# Patient Record
Sex: Male | Born: 1950
Health system: Southern US, Community
[De-identification: ages and names within clinical notes are randomized; demographics above are authoritative.]

## PROBLEM LIST (undated history)

## (undated) DIAGNOSIS — I350 Nonrheumatic aortic (valve) stenosis: Secondary | ICD-10-CM

## (undated) DIAGNOSIS — I2510 Atherosclerotic heart disease of native coronary artery without angina pectoris: Secondary | ICD-10-CM

## (undated) DIAGNOSIS — K852 Alcohol induced acute pancreatitis without necrosis or infection: Secondary | ICD-10-CM

## (undated) DIAGNOSIS — F1010 Alcohol abuse, uncomplicated: Secondary | ICD-10-CM

## (undated) DIAGNOSIS — E785 Hyperlipidemia, unspecified: Secondary | ICD-10-CM

## (undated) DIAGNOSIS — I5189 Other ill-defined heart diseases: Secondary | ICD-10-CM

## (undated) DIAGNOSIS — Z789 Other specified health status: Secondary | ICD-10-CM

## (undated) DIAGNOSIS — I358 Other nonrheumatic aortic valve disorders: Secondary | ICD-10-CM

## (undated) DIAGNOSIS — G709 Myoneural disorder, unspecified: Secondary | ICD-10-CM

## (undated) DIAGNOSIS — R011 Cardiac murmur, unspecified: Secondary | ICD-10-CM

## (undated) DIAGNOSIS — K859 Acute pancreatitis without necrosis or infection, unspecified: Secondary | ICD-10-CM

## (undated) DIAGNOSIS — Z8679 Personal history of other diseases of the circulatory system: Secondary | ICD-10-CM

## (undated) DIAGNOSIS — M1990 Unspecified osteoarthritis, unspecified site: Secondary | ICD-10-CM

## (undated) DIAGNOSIS — K219 Gastro-esophageal reflux disease without esophagitis: Secondary | ICD-10-CM

## (undated) DIAGNOSIS — Q2381 Bicuspid aortic valve: Secondary | ICD-10-CM

## (undated) DIAGNOSIS — Z981 Arthrodesis status: Secondary | ICD-10-CM

## (undated) HISTORY — DX: Other nonrheumatic aortic valve disorders: I35.8

## (undated) HISTORY — DX: Hyperlipidemia, unspecified: E78.5

## (undated) HISTORY — PX: LUMBAR FUSION: SHX111

## (undated) HISTORY — DX: Nonrheumatic aortic (valve) stenosis: I35.0

---

## 2000-05-16 ENCOUNTER — Ambulatory Visit (HOSPITAL_COMMUNITY): Admission: RE | Admit: 2000-05-16 | Discharge: 2000-05-16 | Payer: Self-pay | Admitting: Neurosurgery

## 2000-05-16 ENCOUNTER — Encounter: Payer: Self-pay | Admitting: Neurosurgery

## 2000-05-30 ENCOUNTER — Ambulatory Visit (HOSPITAL_COMMUNITY): Admission: RE | Admit: 2000-05-30 | Discharge: 2000-05-30 | Payer: Self-pay | Admitting: Neurosurgery

## 2000-05-30 ENCOUNTER — Encounter: Payer: Self-pay | Admitting: Neurosurgery

## 2000-06-13 ENCOUNTER — Encounter: Payer: Self-pay | Admitting: Neurosurgery

## 2000-06-13 ENCOUNTER — Ambulatory Visit (HOSPITAL_COMMUNITY): Admission: RE | Admit: 2000-06-13 | Discharge: 2000-06-13 | Payer: Self-pay | Admitting: Neurosurgery

## 2001-12-15 ENCOUNTER — Emergency Department (HOSPITAL_COMMUNITY): Admission: EM | Admit: 2001-12-15 | Discharge: 2001-12-16 | Payer: Self-pay | Admitting: Emergency Medicine

## 2001-12-15 ENCOUNTER — Encounter: Payer: Self-pay | Admitting: Emergency Medicine

## 2002-07-01 ENCOUNTER — Encounter: Payer: Self-pay | Admitting: Internal Medicine

## 2002-07-01 ENCOUNTER — Encounter: Admission: RE | Admit: 2002-07-01 | Discharge: 2002-07-01 | Payer: Self-pay | Admitting: Internal Medicine

## 2002-07-06 ENCOUNTER — Ambulatory Visit (HOSPITAL_COMMUNITY): Admission: RE | Admit: 2002-07-06 | Discharge: 2002-07-06 | Payer: Self-pay | Admitting: Internal Medicine

## 2004-06-08 ENCOUNTER — Ambulatory Visit (HOSPITAL_COMMUNITY): Admission: RE | Admit: 2004-06-08 | Discharge: 2004-06-08 | Payer: Self-pay | Admitting: Gastroenterology

## 2005-05-06 ENCOUNTER — Encounter: Admission: RE | Admit: 2005-05-06 | Discharge: 2005-05-06 | Payer: Self-pay | Admitting: Internal Medicine

## 2008-09-14 ENCOUNTER — Emergency Department: Payer: Self-pay | Admitting: Emergency Medicine

## 2009-02-11 ENCOUNTER — Inpatient Hospital Stay: Payer: Self-pay | Admitting: Internal Medicine

## 2010-02-06 ENCOUNTER — Inpatient Hospital Stay (HOSPITAL_COMMUNITY): Admission: RE | Admit: 2010-02-06 | Discharge: 2010-02-08 | Payer: Self-pay | Admitting: Neurosurgery

## 2010-07-12 LAB — COMPREHENSIVE METABOLIC PANEL
ALT: 16 U/L (ref 0–53)
AST: 22 U/L (ref 0–37)
Albumin: 4.3 g/dL (ref 3.5–5.2)
Alkaline Phosphatase: 45 U/L (ref 39–117)
BUN: 10 mg/dL (ref 6–23)
CO2: 31 mEq/L (ref 19–32)
Calcium: 9.8 mg/dL (ref 8.4–10.5)
Chloride: 103 mEq/L (ref 96–112)
Creatinine, Ser: 1.05 mg/dL (ref 0.4–1.5)
GFR calc Af Amer: >60 mL/min (ref >60)
GFR calc non Af Amer: >60 mL/min (ref >60)
Glucose, Bld: 99 mg/dL (ref 70–99)
Potassium: 4.4 mEq/L (ref 3.5–5.1)
Sodium: 139 mEq/L (ref 135–145)
Total Bilirubin: 0.5 mg/dL (ref 0.3–1.2)
Total Protein: 7.1 g/dL (ref 6.0–8.3)

## 2010-07-12 LAB — CBC
HCT: 42.4 % (ref 39.0–52.0)
Hemoglobin: 14.6 g/dL (ref 13.0–17.0)
MCH: 33.5 pg (ref 26.0–34.0)
MCHC: 34.4 g/dL (ref 30.0–36.0)
MCV: 97.2 fL (ref 78.0–100.0)
Platelets: 207 10*3/uL (ref 150–400)
RBC: 4.36 MIL/uL (ref 4.22–5.81)
RDW: 12.6 % (ref 11.5–15.5)
WBC: 5 10*3/uL (ref 4.0–10.5)

## 2010-07-12 LAB — TYPE AND SCREEN
ABO/RH(D): O POS
Antibody Screen: NEGATIVE

## 2010-07-12 LAB — SURGICAL PCR SCREEN
MRSA, PCR: NEGATIVE
Staphylococcus aureus: NEGATIVE

## 2010-07-12 LAB — ABO/RH: ABO/RH(D): O POS

## 2010-09-14 NOTE — Op Note (Signed)
NAME:  Dylan Long, Dylan Long                  ACCOUNT NO.:  [id number]   MEDICAL RECORD NO.:  [id number]          PATIENT TYPE:  AMB   LOCATION:  ENDO                         FACILITY:  [facility]   PHYSICIAN:  Matheo L. Malon Kindle., M.D.DATE OF BIRTH:  1950-08-19   DATE OF PROCEDURE:  06/08/2004  DATE OF DISCHARGE:                                 OPERATIVE REPORT   PROCEDURE:  Colonoscopy.   MEDICATIONS:  Fentanyl 100 mcg, Versed 10 mg IV.   SCOPE:  Olympus adjustable pediatric colonoscope.   INDICATIONS FOR PROCEDURE:  Colon cancer screening.   DESCRIPTION OF PROCEDURE:  The procedure had been explained to the patient  and consent obtained. With the patient in the left lateral decubitus  position, a digital exam was performed. The Olympus pediatric scope was  inserted and advanced. The prep was excellent. We were able to  reach the  cecum without difficulty. The ileocecal valve and appendiceal orifice seen.  The scope was withdrawn and the cecum, ascending colon, transverse colon,  splenic flexure, descending and sigmoid colon were seen well. There was no  diverticular disease seen. No polyps were seen throughout. The rectum was  free of polyps. The scope was withdrawn.  The patient tolerated the  procedure well.   ASSESSMENT:  Normal screening colonoscopy, V76.51.   PLAN:  Will recommend yearly Hemoccults and repeat colonoscopy in five  years.      JLE/MEDQ  D:  06/08/2004  T:  06/08/2004  Job:  [id number]   cc:   Loraine Leriche A. Waynard Edwards, M.D.  [address]  [place]  [place] [address]  Fax: [phone]

## 2010-09-14 NOTE — Op Note (Signed)
   NAME:  Dylan Long, Dylan Long NO.:  [id number]   MEDICAL RECORD NO.:  [id number]                   PATIENT TYPE:  EMS   LOCATION:  MAJO                                 FACILITY:  [facility]   PHYSICIAN:  Haralambos L. Malon Kindle., M.D.          DATE OF BIRTH:  [birth year]   DATE OF PROCEDURE:  12/16/2001  DATE OF DISCHARGE:                                 OPERATIVE REPORT   PROCEDURE:  Esophagogastroduodenoscopy and removal of food impaction.   MEDICATIONS:  Cetacaine spray, fentanyl 100 mcg, Versed 10 mg IV.   INDICATIONS:  A 60 year old gentleman with a long history of heartburn.  Was  eating steak tonight approximately four hours ago, got it stuck and has been  unable to swallow since.   DESCRIPTION OF PROCEDURE:  The procedure, including the potential risks and  complications, was explained to the patient and his wife and consent  obtained.  With the patient in the left lateral decubitus position, the  scope was inserted.  He continued to retch.  We passed down in the  esophagus.  There was a meat impaction admidst ulcerative esophagitis that  we passed the scope down on top of with a really very gentle pressure, and  it pushed on into the stomach easily.  The scope easily passed in the  stomach.  The pylorus was identified and passed.  The duodenum, including  the bulb and second portion, was grossly normal.  The pyloric channel was  normal, as was then antrum and body of the stomach, with no ulcerations or  masses.  The fundus and cardia were seen in the retroflex views and appeared  to be normal.  There was a fairly large hiatal hernia and in the distal  esophagus was marked ulcerative esophagitis.  The scope easily passed  through this.  There were no residual food particles.  The scope was  withdrawn.  The patient tolerated the procedure well reasonably well, was  awake and alert at the termination of the procedure.   ASSESSMENT:  1. Meat impaction,  relieved.  2. Fairly significant ulcerative esophagitis.   PLAN:  Will begin the patient on Aciphex and see back in the office in six  weeks.  He will take clear liquids tonight and call for any chest pain,  trouble breathing, etc.                                               Tyliek L. Malon Kindle., M.D.    Waldron Session  D:  12/16/2001  T:  12/17/2001  Job:  [id number]   cc:   Loraine Leriche A. Perini, M.D.

## 2011-01-23 ENCOUNTER — Other Ambulatory Visit: Payer: Self-pay | Admitting: Internal Medicine

## 2011-01-23 DIAGNOSIS — M5416 Radiculopathy, lumbar region: Secondary | ICD-10-CM

## 2011-01-23 DIAGNOSIS — M1990 Unspecified osteoarthritis, unspecified site: Secondary | ICD-10-CM

## 2011-01-25 ENCOUNTER — Other Ambulatory Visit: Payer: Self-pay | Admitting: Internal Medicine

## 2011-01-25 DIAGNOSIS — M1990 Unspecified osteoarthritis, unspecified site: Secondary | ICD-10-CM

## 2011-01-25 DIAGNOSIS — M5416 Radiculopathy, lumbar region: Secondary | ICD-10-CM

## 2011-01-29 ENCOUNTER — Ambulatory Visit
Admission: RE | Admit: 2011-01-29 | Discharge: 2011-01-29 | Disposition: A | Discharge status: Home / Self Care | Payer: Medicare HMO | Source: Ambulatory Visit | Attending: Internal Medicine | Admitting: Internal Medicine

## 2011-01-29 DIAGNOSIS — M1990 Unspecified osteoarthritis, unspecified site: Secondary | ICD-10-CM

## 2011-01-29 DIAGNOSIS — M5416 Radiculopathy, lumbar region: Secondary | ICD-10-CM

## 2011-01-29 MED ORDER — GADOBENATE DIMEGLUMINE 529 MG/ML IV SOLN
16.0000 mL | Freq: Once | INTRAVENOUS | Status: AC | PRN
  Administered 2011-01-29: 16 mL via INTRAVENOUS

## 2011-08-14 ENCOUNTER — Telehealth: Payer: Self-pay | Admitting: Cardiology

## 2011-08-14 NOTE — Telephone Encounter (Signed)
Patient called wanting to know his diagnosis.States his daughter just had a baby that has a heart murmur.Patient was told diagnosis and advised needs to schedule appointment.

## 2011-08-14 NOTE — Telephone Encounter (Signed)
Patient was called back appointment scheduled with Dr.Jordan 09/26/11.

## 2011-08-14 NOTE — Telephone Encounter (Signed)
New msg Pt has seen Dr Reyes Ivan in the past and wanted to talk to you about his diagnosis. Please call him back

## 2011-08-14 NOTE — Telephone Encounter (Signed)
Patient called no answer,LMTC 

## 2011-08-24 IMAGING — CR DG LUMBAR SPINE 1V
1 series · 1 of 1 positions shown · non-contrast
Comparison: None

CLINICAL DATA: Lumbar decompression surgery.  Intraoperative
localization.

LUMBAR SPINE - 1 VIEW

[view not recorded]
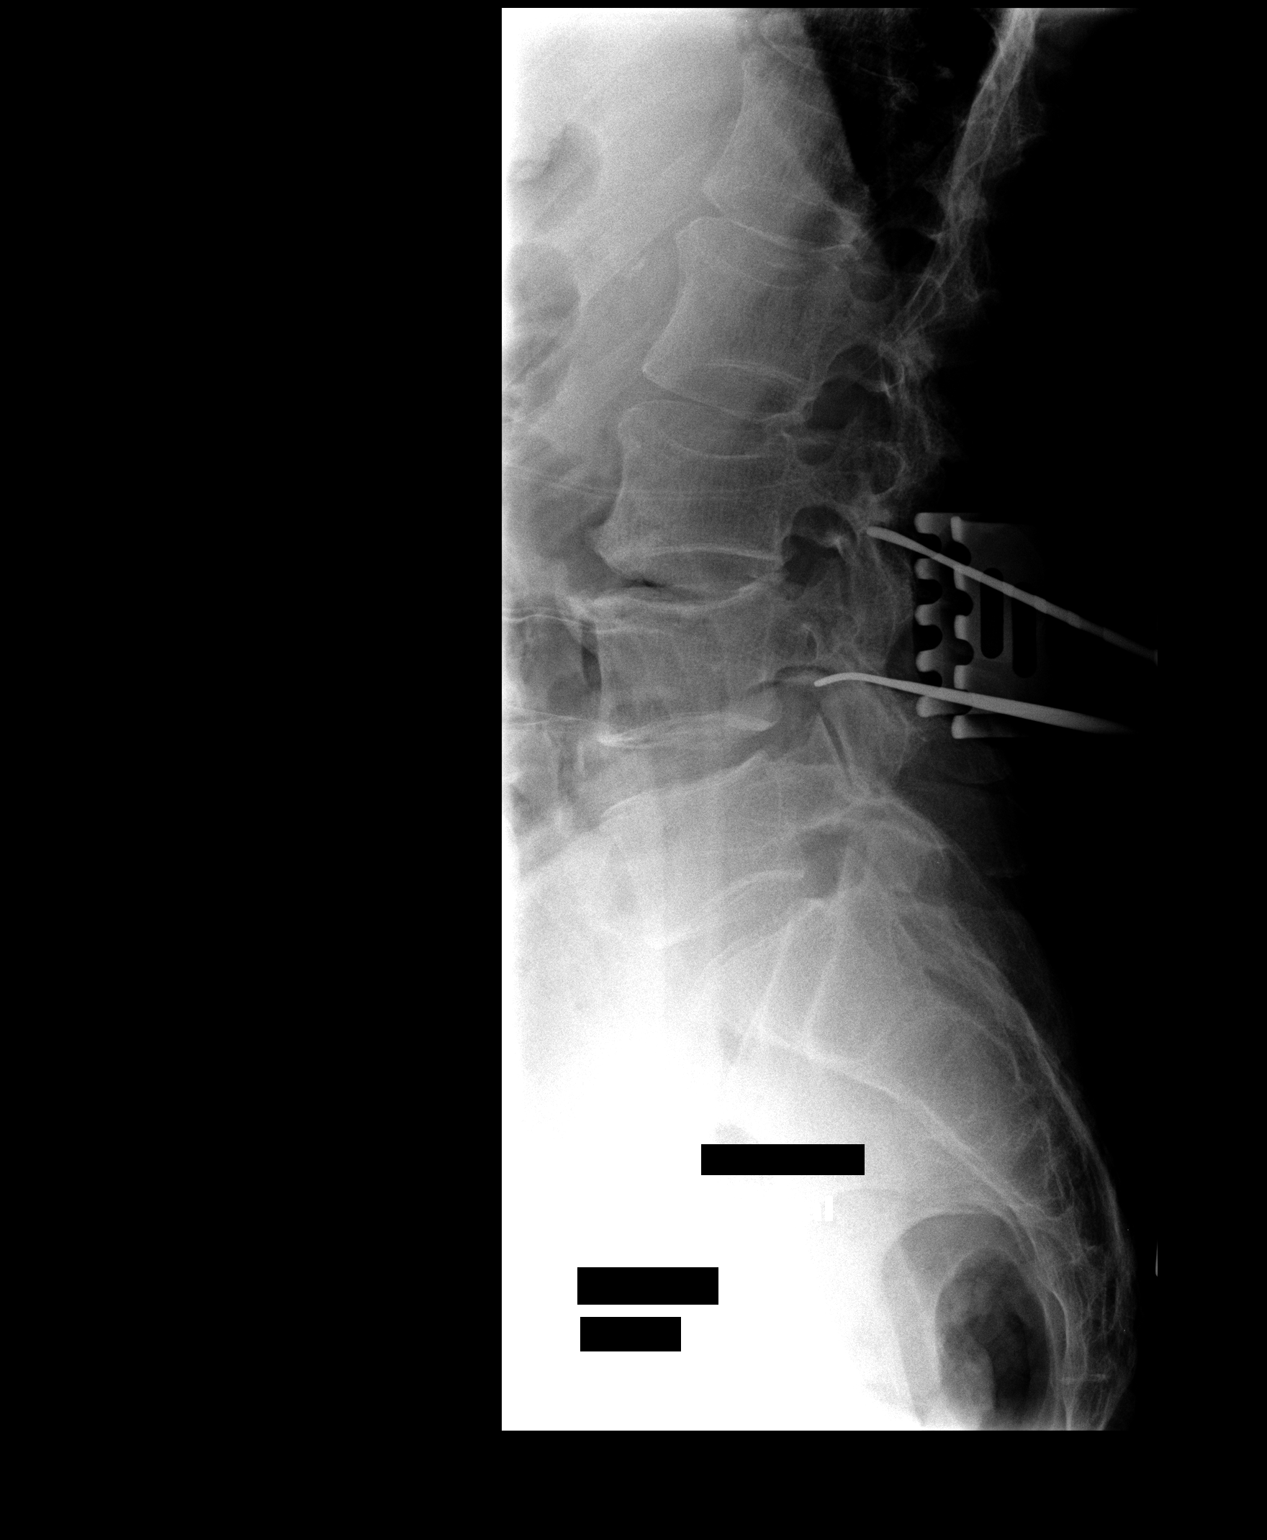

[1 of 1 positions shown; findings below may reference images not displayed]

FINDINGS: There are surgical instruments marking the L3 and L4
vertebral body levels.
IMPRESSION: L3 and L4 marked intraoperatively.

## 2011-09-26 ENCOUNTER — Encounter: Payer: Self-pay | Admitting: Cardiology

## 2011-09-26 ENCOUNTER — Ambulatory Visit: Payer: Medicare HMO | Admitting: Cardiology

## 2011-09-26 ENCOUNTER — Ambulatory Visit (INDEPENDENT_AMBULATORY_CARE_PROVIDER_SITE_OTHER): Payer: Medicare HMO | Admitting: Cardiology

## 2011-09-26 VITALS — BP 160/86 | HR 58 | Ht 75.0 in | Wt 178.4 lb

## 2011-09-26 DIAGNOSIS — E785 Hyperlipidemia, unspecified: Secondary | ICD-10-CM

## 2011-09-26 DIAGNOSIS — Q2381 Bicuspid aortic valve: Secondary | ICD-10-CM

## 2011-09-26 DIAGNOSIS — Q231 Congenital insufficiency of aortic valve: Secondary | ICD-10-CM

## 2011-09-26 NOTE — Patient Instructions (Signed)
We will schedule you for an echocardiogram  If stable we'll just check you again in 3 years.

## 2011-09-26 NOTE — Assessment & Plan Note (Signed)
He has a history of very mild aortic stenosis. We will update his echocardiogram. If stable I would recommend repeat in 3 years.

## 2011-09-26 NOTE — Progress Notes (Signed)
   Osborne Oman Date of Birth: 1951-01-16 Medical Record #[id number]  History of Present Illness: Mr. Cleckler is seen today for followup of a bicuspid aortic valve. He is a pleasant 61 year old male previously followed by Dr. Reyes Ivan who has a history of bicuspid aortic valve. His last echocardiogram in 2010 showed a mean gradient of 9 mmHg, peak gradient of 17 mm mercury, and a valve area of 2.2 cm square. He remains asymptomatic. He has no chest pain, dizziness, or shortness of breath. He remains very active. 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  Current Outpatient Prescriptions on File Prior to Visit  Medication Sig Dispense Refill  . HYDROcodone-acetaminophen (VICODIN) 5-500 MG per tablet Take 1 tablet by mouth every 6 (six) hours as needed.      . RABEprazole (ACIPHEX) 20 MG tablet Take 20 mg by mouth daily.      . rosuvastatin (CRESTOR) 5 MG tablet Take 5 mg by mouth daily. 5 mg once a week      . temazepam (RESTORIL) 15 MG capsule Take 30 mg by mouth at bedtime as needed.         No Known Allergies  Past Medical History  Diagnosis Date  . Aortic stenosis   . Dyslipidemia   . Aortic valve sclerosis     with functionally bicuspid aortic valve    Past Surgical History  Procedure Date  . Lumbar fusion     History  Smoking status  . Former Smoker  . Quit date: 09/26/1999  Smokeless tobacco  . Not on file    History  Alcohol Use  . Yes    Family History  Problem Relation Age of Onset  . Stroke Mother   . Heart disease Father   . Heart attack Father   . Hyperlipidemia Brother     Dyslipidemia    Review of Systems: The review of systems is positive for chronic back pain.  All other systems were reviewed and are negative.  Physical Exam: BP 160/86  Pulse 58  Ht 6\' 3"  (1.905 m)  Wt 178 lb 6.4 oz (80.922 kg)  BMI 22.30 kg/m2 He is a tall, slender white male in no acute distress. He is normocephalic, atraumatic. Pupils are equal round  reactive. Sclera clear. He wears glasses. Oropharynx is clear. Neck is supple without JVD, adenopathy, thyromegaly, or bruits. Chronic obstruction brisk. Lungs are clear. Cardiac exam reveals a regular rate and rhythm with a grade 2/6 systolic ejection murmur loudest at the left lower sternal border radiating to the right upper sternal border. Abdomen is thin, soft, nontender. No masses or bruits. Extremities are without edema. Femoral and pedal pulses are 2+. He is alert and oriented x3. He is hard of hearing. Cranial nerves II through XII are otherwise intact. LABORATORY DATA: ECG today shows normal sinus rhythm with a rate of 58 beats per minute. It is normal.  Assessment / Plan:

## 2011-10-08 ENCOUNTER — Ambulatory Visit (HOSPITAL_COMMUNITY): Payer: Private Health Insurance - Indemnity | Attending: Internal Medicine

## 2011-10-08 DIAGNOSIS — I359 Nonrheumatic aortic valve disorder, unspecified: Secondary | ICD-10-CM | POA: Insufficient documentation

## 2011-10-08 DIAGNOSIS — Q231 Congenital insufficiency of aortic valve: Secondary | ICD-10-CM | POA: Insufficient documentation

## 2011-10-08 DIAGNOSIS — I517 Cardiomegaly: Secondary | ICD-10-CM | POA: Insufficient documentation

## 2011-10-08 DIAGNOSIS — E785 Hyperlipidemia, unspecified: Secondary | ICD-10-CM | POA: Insufficient documentation

## 2011-10-08 NOTE — Progress Notes (Signed)
Echocardiogram performed.  

## 2011-10-11 ENCOUNTER — Telehealth: Payer: Self-pay | Admitting: Emergency Medicine

## 2011-10-11 ENCOUNTER — Telehealth: Payer: Self-pay | Admitting: Cardiology

## 2011-10-11 NOTE — Telephone Encounter (Signed)
Pt notified of echo results

## 2011-10-11 NOTE — Telephone Encounter (Signed)
Patient returning nurse call, he can be reached at [phone]

## 2011-10-11 NOTE — Telephone Encounter (Signed)
error 

## 2011-10-16 ENCOUNTER — Other Ambulatory Visit: Payer: Self-pay

## 2011-10-28 ENCOUNTER — Telehealth: Payer: Self-pay | Admitting: Cardiology

## 2011-10-28 NOTE — Telephone Encounter (Signed)
Amanda/Dr.Perini Office Calling asking for Referral Form faxed over last Week, she Needs Back Call her @  [phone]

## 2011-10-28 NOTE — Telephone Encounter (Signed)
Dr.Perini's office called already closed.Will call tomorrow 10/29/11.

## 2011-10-29 NOTE — Telephone Encounter (Signed)
Spoke to Margaret at Dr.Perini's office she stated she needed proof that patient was referred to our practice.States patient's insurance has denied payment and she was trying to obtain proof that patient was referred here.Marchelle Folks was told patient is a old patient of Dr.Kersey unable to find anything.Marchelle Folks stated she will continue to look and she will call back if needed.

## 2012-05-06 ENCOUNTER — Encounter: Payer: Self-pay | Admitting: Cardiovascular Disease

## 2013-01-27 ENCOUNTER — Ambulatory Visit (INDEPENDENT_AMBULATORY_CARE_PROVIDER_SITE_OTHER): Payer: Managed Care, Other (non HMO)

## 2013-01-27 VITALS — BP 168/92 | HR 59 | Temp 97.0°F | Resp 12 | Ht 76.0 in | Wt 180.0 lb

## 2013-01-27 DIAGNOSIS — M722 Plantar fascial fibromatosis: Secondary | ICD-10-CM

## 2013-01-27 NOTE — Patient Instructions (Signed)
Plantar Fasciitis Plantar fasciitis is a common condition that causes foot pain. It is soreness (inflammation) of the band of tough fibrous tissue on the bottom of the foot that runs from the heel bone (calcaneus) to the ball of the foot. The cause of this soreness may be from excessive standing, poor fitting shoes, running on hard surfaces, being overweight, having an abnormal walk, or overuse (this is common in runners) of the painful foot or feet. It is also common in aerobic exercise dancers and ballet dancers. SYMPTOMS  Most people with plantar fasciitis complain of:  Severe pain in the morning on the bottom of their foot especially when taking the first steps out of bed. This pain recedes after a few minutes of walking.  Severe pain is experienced also during walking following a long period of inactivity.  Pain is worse when walking barefoot or up stairs DIAGNOSIS   Your caregiver will diagnose this condition by examining and feeling your foot.  Special tests such as X-rays of your foot, are usually not needed. PREVENTION   Consult a sports medicine professional before beginning a new exercise program.  Walking programs offer a good workout. With walking there is a lower chance of overuse injuries common to runners. There is less impact and less jarring of the joints.  Begin all new exercise programs slowly. If problems or pain develop, decrease the amount of time or distance until you are at a comfortable level.  Wear good shoes and replace them regularly.  Stretch your foot and the heel cords at the back of the ankle (Achilles tendon) both before and after exercise.  Run or exercise on even surfaces that are not hard. For example, asphalt is better than pavement.  Do not run barefoot on hard surfaces.  If using a treadmill, vary the incline.  Do not continue to workout if you have foot or joint problems. Seek professional help if they do not improve. HOME CARE INSTRUCTIONS     Avoid activities that cause you pain until you recover.  Use ice or cold packs on the problem or painful areas after working out.  Only take over-the-counter or prescription medicines for pain, discomfort, or fever as directed by your caregiver.  Soft shoe inserts or athletic shoes with air or gel sole cushions may be helpful.  If problems continue or become more severe, consult a sports medicine caregiver or your own health care provider. Cortisone is a potent anti-inflammatory medication that may be injected into the painful area. You can discuss this treatment with your caregiver. MAKE SURE YOU:   Understand these instructions.  Will watch your condition.  Will get help right away if you are not doing well or get worse. Document Released: 01/08/2001 Document Revised: 07/08/2011 Document Reviewed: 03/09/2008 [facility] Patient Information 2014 [place], [place].   Continue Mobic 15 mg once daily, maintain fascial strapping for 5 more days. Apply ice to inferior heel as needed.

## 2013-01-27 NOTE — Progress Notes (Signed)
Mr. Dylan Long was seen today for followup of plantar fasciitis right foot, medications and history were reviewed. Patient indicated improvement with fascial strapping and steroid injection. Based on improvement patient is a candidate for functional orthoses. Neurovascular status intact and unchanged. Orthotic skin is carried out at today's visit and fascial strapping reapplied for 5 days. Patient will maintain Mobic 15 mg once daily as needed for pain. Patiently contacted when orthotics are ready with the next 3-4 weeks orthotics which dispensed and fitted at that time.  Alvan Dame DPM

## 2013-03-12 ENCOUNTER — Ambulatory Visit (INDEPENDENT_AMBULATORY_CARE_PROVIDER_SITE_OTHER): Payer: Managed Care, Other (non HMO)

## 2013-03-12 VITALS — BP 127/74 | HR 62 | Resp 16

## 2013-03-12 DIAGNOSIS — M722 Plantar fascial fibromatosis: Secondary | ICD-10-CM

## 2013-03-12 NOTE — Patient Instructions (Signed)

## 2013-03-12 NOTE — Progress Notes (Signed)
  Subjective:    Patient ID: Dylan Long, male    DOB: 1950-12-23, 62 y.o.   MRN: [id number] "They're not too bad."  Patient presents to pick up orthotics, instructions were given.  HPI no changes in history    Review of Systems     Objective:   Physical Exam No changes neurovascular status intact oral and written instructions given and orthotic use and break in period patient is persistent plantar fasciitis/heel spur syndrome followup having had improvement with injections and strappings. Orthotics are dispensed at this time      Assessment & Plan:  Dispensed orthotics with wrist reconstruction followup in one to 2 months for adjustments if needed maintain orthotics and shoes with a good lace up orthotics shoe at all times. Recheck within a month or 2 if needed otherwise should last 5-10 t. Assessment plantar fasciitis/heel spurs syndrome  Alvan Dame DPM

## 2015-09-14 ENCOUNTER — Other Ambulatory Visit (HOSPITAL_COMMUNITY): Payer: Self-pay | Admitting: Internal Medicine

## 2015-09-14 DIAGNOSIS — I350 Nonrheumatic aortic (valve) stenosis: Secondary | ICD-10-CM

## 2015-09-20 ENCOUNTER — Ambulatory Visit (HOSPITAL_COMMUNITY): Payer: Managed Care, Other (non HMO) | Attending: Cardiovascular Disease

## 2015-09-20 ENCOUNTER — Other Ambulatory Visit: Payer: Self-pay

## 2015-09-20 DIAGNOSIS — Z87891 Personal history of nicotine dependence: Secondary | ICD-10-CM | POA: Insufficient documentation

## 2015-09-20 DIAGNOSIS — I071 Rheumatic tricuspid insufficiency: Secondary | ICD-10-CM | POA: Insufficient documentation

## 2015-09-20 DIAGNOSIS — I350 Nonrheumatic aortic (valve) stenosis: Secondary | ICD-10-CM

## 2015-09-20 DIAGNOSIS — E785 Hyperlipidemia, unspecified: Secondary | ICD-10-CM | POA: Insufficient documentation

## 2015-09-20 DIAGNOSIS — I352 Nonrheumatic aortic (valve) stenosis with insufficiency: Secondary | ICD-10-CM | POA: Insufficient documentation

## 2015-09-20 DIAGNOSIS — I340 Nonrheumatic mitral (valve) insufficiency: Secondary | ICD-10-CM | POA: Insufficient documentation

## 2015-11-13 DIAGNOSIS — M5417 Radiculopathy, lumbosacral region: Secondary | ICD-10-CM | POA: Diagnosis not present

## 2015-11-13 DIAGNOSIS — M9905 Segmental and somatic dysfunction of pelvic region: Secondary | ICD-10-CM | POA: Diagnosis not present

## 2015-11-13 DIAGNOSIS — M5136 Other intervertebral disc degeneration, lumbar region: Secondary | ICD-10-CM | POA: Diagnosis not present

## 2015-11-13 DIAGNOSIS — M9903 Segmental and somatic dysfunction of lumbar region: Secondary | ICD-10-CM | POA: Diagnosis not present

## 2015-11-14 DIAGNOSIS — M9903 Segmental and somatic dysfunction of lumbar region: Secondary | ICD-10-CM | POA: Diagnosis not present

## 2015-11-14 DIAGNOSIS — M5417 Radiculopathy, lumbosacral region: Secondary | ICD-10-CM | POA: Diagnosis not present

## 2015-11-14 DIAGNOSIS — M9905 Segmental and somatic dysfunction of pelvic region: Secondary | ICD-10-CM | POA: Diagnosis not present

## 2015-11-14 DIAGNOSIS — M5136 Other intervertebral disc degeneration, lumbar region: Secondary | ICD-10-CM | POA: Diagnosis not present

## 2015-12-18 DIAGNOSIS — B078 Other viral warts: Secondary | ICD-10-CM | POA: Diagnosis not present

## 2015-12-26 DIAGNOSIS — M5136 Other intervertebral disc degeneration, lumbar region: Secondary | ICD-10-CM | POA: Diagnosis not present

## 2015-12-26 DIAGNOSIS — M9903 Segmental and somatic dysfunction of lumbar region: Secondary | ICD-10-CM | POA: Diagnosis not present

## 2015-12-26 DIAGNOSIS — M5417 Radiculopathy, lumbosacral region: Secondary | ICD-10-CM | POA: Diagnosis not present

## 2015-12-26 DIAGNOSIS — M9905 Segmental and somatic dysfunction of pelvic region: Secondary | ICD-10-CM | POA: Diagnosis not present

## 2015-12-27 DIAGNOSIS — M9903 Segmental and somatic dysfunction of lumbar region: Secondary | ICD-10-CM | POA: Diagnosis not present

## 2015-12-27 DIAGNOSIS — M5136 Other intervertebral disc degeneration, lumbar region: Secondary | ICD-10-CM | POA: Diagnosis not present

## 2015-12-27 DIAGNOSIS — M5417 Radiculopathy, lumbosacral region: Secondary | ICD-10-CM | POA: Diagnosis not present

## 2015-12-27 DIAGNOSIS — M9905 Segmental and somatic dysfunction of pelvic region: Secondary | ICD-10-CM | POA: Diagnosis not present

## 2015-12-28 DIAGNOSIS — M9905 Segmental and somatic dysfunction of pelvic region: Secondary | ICD-10-CM | POA: Diagnosis not present

## 2015-12-28 DIAGNOSIS — M5136 Other intervertebral disc degeneration, lumbar region: Secondary | ICD-10-CM | POA: Diagnosis not present

## 2015-12-28 DIAGNOSIS — M9903 Segmental and somatic dysfunction of lumbar region: Secondary | ICD-10-CM | POA: Diagnosis not present

## 2015-12-28 DIAGNOSIS — M5417 Radiculopathy, lumbosacral region: Secondary | ICD-10-CM | POA: Diagnosis not present

## 2016-04-19 DIAGNOSIS — B078 Other viral warts: Secondary | ICD-10-CM | POA: Diagnosis not present

## 2016-06-06 DIAGNOSIS — B078 Other viral warts: Secondary | ICD-10-CM | POA: Diagnosis not present

## 2016-07-22 DIAGNOSIS — B078 Other viral warts: Secondary | ICD-10-CM | POA: Diagnosis not present

## 2016-07-22 DIAGNOSIS — L821 Other seborrheic keratosis: Secondary | ICD-10-CM | POA: Diagnosis not present

## 2016-08-23 ENCOUNTER — Encounter: Payer: Self-pay | Admitting: Emergency Medicine

## 2016-08-23 ENCOUNTER — Emergency Department: Payer: PPO

## 2016-08-23 ENCOUNTER — Inpatient Hospital Stay
Admission: EM | Admit: 2016-08-23 | Discharge: 2016-08-24 | DRG: 440 | Disposition: A | Discharge status: Home / Self Care | Payer: PPO | Attending: Internal Medicine | Admitting: Internal Medicine

## 2016-08-23 DIAGNOSIS — Z8249 Family history of ischemic heart disease and other diseases of the circulatory system: Secondary | ICD-10-CM | POA: Diagnosis not present

## 2016-08-23 DIAGNOSIS — R1011 Right upper quadrant pain: Secondary | ICD-10-CM | POA: Diagnosis not present

## 2016-08-23 DIAGNOSIS — K859 Acute pancreatitis without necrosis or infection, unspecified: Secondary | ICD-10-CM | POA: Diagnosis not present

## 2016-08-23 DIAGNOSIS — I350 Nonrheumatic aortic (valve) stenosis: Secondary | ICD-10-CM | POA: Diagnosis not present

## 2016-08-23 DIAGNOSIS — Z87891 Personal history of nicotine dependence: Secondary | ICD-10-CM

## 2016-08-23 DIAGNOSIS — K219 Gastro-esophageal reflux disease without esophagitis: Secondary | ICD-10-CM | POA: Diagnosis present

## 2016-08-23 DIAGNOSIS — F1010 Alcohol abuse, uncomplicated: Secondary | ICD-10-CM | POA: Diagnosis present

## 2016-08-23 DIAGNOSIS — D72829 Elevated white blood cell count, unspecified: Secondary | ICD-10-CM | POA: Diagnosis not present

## 2016-08-23 DIAGNOSIS — K852 Alcohol induced acute pancreatitis without necrosis or infection: Principal | ICD-10-CM | POA: Diagnosis present

## 2016-08-23 DIAGNOSIS — E785 Hyperlipidemia, unspecified: Secondary | ICD-10-CM | POA: Diagnosis not present

## 2016-08-23 DIAGNOSIS — R079 Chest pain, unspecified: Secondary | ICD-10-CM | POA: Diagnosis not present

## 2016-08-23 LAB — CBC
HCT: 49.6 % (ref 40.0–52.0)
Hemoglobin: 16.8 g/dL (ref 13.0–18.0)
MCH: 32.4 pg (ref 26.0–34.0)
MCHC: 33.8 g/dL (ref 32.0–36.0)
MCV: 95.9 fL (ref 80.0–100.0)
Platelets: 240 10*3/uL (ref 150–440)
RBC: 5.17 MIL/uL (ref 4.40–5.90)
RDW: 13.2 % (ref 11.5–14.5)
WBC: 16.8 10*3/uL — ABNORMAL HIGH (ref 3.8–10.6)

## 2016-08-23 LAB — BASIC METABOLIC PANEL
Anion gap: 7 (ref 5–15)
BUN: 15 mg/dL (ref 6–20)
CO2: 29 mmol/L (ref 22–32)
Calcium: 9.8 mg/dL (ref 8.9–10.3)
Chloride: 99 mmol/L — ABNORMAL LOW (ref 101–111)
Creatinine, Ser: 1.15 mg/dL (ref 0.61–1.24)
GFR calc Af Amer: >60 mL/min (ref >60)
GFR calc non Af Amer: >60 mL/min (ref >60)
Glucose, Bld: 145 mg/dL — ABNORMAL HIGH (ref 65–99)
Potassium: 4.2 mmol/L (ref 3.5–5.1)
Sodium: 135 mmol/L (ref 135–145)

## 2016-08-23 LAB — HEPATIC FUNCTION PANEL
ALT: 61 U/L (ref 17–63)
AST: 118 U/L — ABNORMAL HIGH (ref 15–41)
Albumin: 4.9 g/dL (ref 3.5–5.0)
Alkaline Phosphatase: 53 U/L (ref 38–126)
Bilirubin, Direct: 0.6 mg/dL — ABNORMAL HIGH (ref 0.1–0.5)
Indirect Bilirubin: 0.9 mg/dL (ref 0.3–0.9)
Total Bilirubin: 1.5 mg/dL — ABNORMAL HIGH (ref 0.3–1.2)
Total Protein: 8.2 g/dL — ABNORMAL HIGH (ref 6.5–8.1)

## 2016-08-23 LAB — TROPONIN I: Troponin I: <0.03 ng/mL (ref <0.03)

## 2016-08-23 LAB — LIPASE, BLOOD: Lipase: 1232 U/L — ABNORMAL HIGH (ref 11–51)

## 2016-08-23 MED ORDER — ACETAMINOPHEN 650 MG RE SUPP: 650.0000 mg | Freq: Four times a day (QID) | RECTAL | Status: DC | PRN

## 2016-08-23 MED ORDER — SENNOSIDES-DOCUSATE SODIUM 8.6-50 MG PO TABS: 1.0000 | ORAL_TABLET | Freq: Every evening | ORAL | Status: DC | PRN

## 2016-08-23 MED ORDER — LORAZEPAM 2 MG PO TABS: 0.0000 mg | ORAL_TABLET | Freq: Four times a day (QID) | ORAL | Status: DC

## 2016-08-23 MED ORDER — LORAZEPAM 1 MG PO TABS
1.0000 mg | ORAL_TABLET | Freq: Four times a day (QID) | ORAL | Status: DC | PRN
  Administered 2016-08-23: 1 mg via ORAL
  Filled 2016-08-23: qty 1

## 2016-08-23 MED ORDER — ADULT MULTIVITAMIN W/MINERALS CH: 1.0000 | ORAL_TABLET | Freq: Every day | ORAL | Status: DC

## 2016-08-23 MED ORDER — OXYCODONE-ACETAMINOPHEN 5-325 MG PO TABS
1.0000 | ORAL_TABLET | Freq: Once | ORAL | Status: AC
  Administered 2016-08-23: 1 via ORAL
  Filled 2016-08-23: qty 1

## 2016-08-23 MED ORDER — HYDROMORPHONE HCL 1 MG/ML IJ SOLN: 1.0000 mg | INTRAMUSCULAR | Status: DC | PRN

## 2016-08-23 MED ORDER — ONDANSETRON HCL 4 MG PO TABS: 4.0000 mg | ORAL_TABLET | Freq: Three times a day (TID) | ORAL | 0 refills | Status: DC | PRN

## 2016-08-23 MED ORDER — SODIUM CHLORIDE 0.9% FLUSH: 3.0000 mL | Freq: Two times a day (BID) | INTRAVENOUS | Status: DC

## 2016-08-23 MED ORDER — ONDANSETRON HCL 4 MG/2ML IJ SOLN
4.0000 mg | Freq: Once | INTRAMUSCULAR | Status: AC
  Administered 2016-08-23: 4 mg via INTRAVENOUS
  Filled 2016-08-23: qty 2

## 2016-08-23 MED ORDER — ENOXAPARIN SODIUM 40 MG/0.4ML ~~LOC~~ SOLN
40.0000 mg | SUBCUTANEOUS | Status: DC
  Administered 2016-08-23: 40 mg via SUBCUTANEOUS
  Filled 2016-08-23: qty 0.4

## 2016-08-23 MED ORDER — PANTOPRAZOLE SODIUM 40 MG IV SOLR
40.0000 mg | Freq: Two times a day (BID) | INTRAVENOUS | Status: DC
  Administered 2016-08-23: 40 mg via INTRAVENOUS
  Filled 2016-08-23 (×2): qty 40

## 2016-08-23 MED ORDER — ONDANSETRON HCL 4 MG/2ML IJ SOLN: 4.0000 mg | Freq: Four times a day (QID) | INTRAMUSCULAR | Status: DC | PRN

## 2016-08-23 MED ORDER — LORAZEPAM 2 MG PO TABS: 0.0000 mg | ORAL_TABLET | Freq: Two times a day (BID) | ORAL | Status: DC

## 2016-08-23 MED ORDER — MORPHINE SULFATE (PF) 4 MG/ML IV SOLN
4.0000 mg | Freq: Once | INTRAVENOUS | Status: AC
  Administered 2016-08-23: 4 mg via INTRAVENOUS
  Filled 2016-08-23: qty 1

## 2016-08-23 MED ORDER — ACETAMINOPHEN 325 MG PO TABS: 650.0000 mg | ORAL_TABLET | Freq: Four times a day (QID) | ORAL | Status: DC | PRN

## 2016-08-23 MED ORDER — SODIUM CHLORIDE 0.9 % IV SOLN
INTRAVENOUS | Status: DC
  Administered 2016-08-23 – 2016-08-24 (×3): via INTRAVENOUS

## 2016-08-23 MED ORDER — BISACODYL 5 MG PO TBEC: 5.0000 mg | DELAYED_RELEASE_TABLET | Freq: Every day | ORAL | Status: DC | PRN

## 2016-08-23 MED ORDER — VITAMIN B-1 100 MG PO TABS
100.0000 mg | ORAL_TABLET | Freq: Every day | ORAL | Status: DC
  Administered 2016-08-24: 100 mg via ORAL
  Filled 2016-08-23: qty 1

## 2016-08-23 MED ORDER — KETOROLAC TROMETHAMINE 15 MG/ML IJ SOLN: 15.0000 mg | Freq: Four times a day (QID) | INTRAMUSCULAR | Status: DC | PRN

## 2016-08-23 MED ORDER — THIAMINE HCL 100 MG/ML IJ SOLN
100.0000 mg | Freq: Every day | INTRAMUSCULAR | Status: DC
  Administered 2016-08-23: 100 mg via INTRAVENOUS
  Filled 2016-08-23: qty 2

## 2016-08-23 MED ORDER — ONDANSETRON HCL 4 MG PO TABS: 4.0000 mg | ORAL_TABLET | Freq: Four times a day (QID) | ORAL | Status: DC | PRN

## 2016-08-23 MED ORDER — HYDROCODONE-ACETAMINOPHEN 5-325 MG PO TABS
1.0000 | ORAL_TABLET | ORAL | Status: DC | PRN
  Administered 2016-08-23: 2 via ORAL
  Filled 2016-08-23: qty 2

## 2016-08-23 MED ORDER — FOLIC ACID 1 MG PO TABS
1.0000 mg | ORAL_TABLET | Freq: Every day | ORAL | Status: DC
  Administered 2016-08-24: 1 mg via ORAL
  Filled 2016-08-23: qty 1

## 2016-08-23 MED ORDER — LORAZEPAM 2 MG/ML IJ SOLN: 1.0000 mg | Freq: Four times a day (QID) | INTRAMUSCULAR | Status: DC | PRN

## 2016-08-23 MED ORDER — OXYCODONE-ACETAMINOPHEN 5-325 MG PO TABS: 1.0000 | ORAL_TABLET | Freq: Four times a day (QID) | ORAL | 0 refills | Status: DC | PRN

## 2016-08-23 MED ORDER — SODIUM CHLORIDE 0.9 % IV BOLUS (SEPSIS)
1000.0000 mL | Freq: Once | INTRAVENOUS | Status: AC
  Administered 2016-08-23: 1000 mL via INTRAVENOUS

## 2016-08-23 NOTE — ED Notes (Signed)
AAOx3.  Skin warm and dry.  8 oz water and 2 packs of graham crackers given.  Continue to monitor.

## 2016-08-23 NOTE — ED Notes (Signed)
Pt given warm blanket and is resting at this time.

## 2016-08-23 NOTE — ED Notes (Signed)
Patient transported to [facility] 

## 2016-08-23 NOTE — ED Notes (Signed)
Dr. Alfred Levins stating that pt will be staying for admit due to pt's [place] being not normal. Pt is aware.

## 2016-08-23 NOTE — ED Provider Notes (Signed)
[facility] Emergency Department Provider Note  ____________________________________________  Time seen: Approximately 12:48 PM  I have reviewed the triage vital signs and the nursing notes.   HISTORY  Chief Complaint Chest Pain   HPI Dylan Long is a 66 y.o. male with history of hyperlipidemia who presents for evaluation of epigastric abdominal pain. Patient reports that he woke up this morning at 7:30 AM with dull pain located in the epigastric region radiating up to his chest. Initially the pain was severe, patient was diaphoretic and clammy according to his wife. Patient has never had similar pain before. He felt nauseous but no vomiting. Patient reports that he drinks 4 beers a day every day. He occasionally smokes marijuana. He stopped smoking cigarettes many years ago. Does not use any other drugs. Has family history his father of ischemic heart disease. Patient does not have a history of heart disease. Currently his pain is moderate and dull, located in his epigastric region, constant, nonradiating. Patient denies ever having abdominal surgeries. No fever, no chills, no dysuria or hematuria, no diarrhea, no melena, no constipation, no hematemesis, coffee-ground emesis, or hematochezia. Patient denies NSAID use.   Past Medical History:  Diagnosis Date  . Aortic stenosis   . Aortic valve sclerosis    with functionally bicuspid aortic valve  . Dyslipidemia     Patient Active Problem List   Diagnosis Date Noted  . Plantar fasciitis of right foot 01/27/2013  . Bicuspid aortic valve 09/26/2011  . Hyperlipidemia 09/26/2011    Past Surgical History:  Procedure Laterality Date  . LUMBAR FUSION      Prior to Admission medications   Medication Sig Start Date End Date Taking? Authorizing Provider  CIALIS 20 MG tablet Take 20 mg by mouth as needed. Take 20 mg by mouth 30 minutes to 36 hours before sexual activity. 07/25/16  Yes Historical Provider, MD    cyclobenzaprine (FLEXERIL) 10 MG tablet Take 10 mg by mouth as needed.   Yes Historical Provider, MD  HYDROcodone-acetaminophen (NORCO/VICODIN) 5-325 MG tablet Take 1-2 tablets by mouth every 6 (six) hours as needed for pain. 06/27/16  Yes Historical Provider, MD  RABEprazole (ACIPHEX) 20 MG tablet Take 20 mg by mouth daily.   Yes Historical Provider, MD  rosuvastatin (CRESTOR) 5 MG tablet Take 5 mg by mouth daily.    Yes Historical Provider, MD  temazepam (RESTORIL) 30 MG capsule Take 30 mg by mouth at bedtime as needed for sleep. 08/15/16  Yes Historical Provider, MD  ondansetron (ZOFRAN) 4 MG tablet Take 1 tablet (4 mg total) by mouth every 8 (eight) hours as needed for nausea or vomiting. 08/23/16   Rudene Re, MD  oxyCODONE-acetaminophen (ROXICET) 5-325 MG tablet Take 1 tablet by mouth every 6 (six) hours as needed. 08/23/16 08/23/17  Rudene Re, MD    Allergies Patient has no known allergies.  Family History  Problem Relation Age of Onset  . Stroke Mother   . Heart disease Father   . Heart attack Father   . Hyperlipidemia Brother     Dyslipidemia    Social History Social History  Substance Use Topics  . Smoking status: Former Smoker    Quit date: 09/26/1999  . Smokeless tobacco: Not on file  . Alcohol use Yes     Comment: 3-4 beers (12 oz) a day    Review of Systems  Constitutional: Negative for fever. Eyes: Negative for visual changes. ENT: Negative for sore throat. Neck: No neck pain  Cardiovascular: Negative for chest pain. Respiratory: Negative for shortness of breath. Gastrointestinal: + epigastric abdominal pain and nausea. No vomiting or diarrhea. Genitourinary: Negative for dysuria. Musculoskeletal: Negative for back pain. Skin: Negative for rash. Neurological: Negative for headaches, weakness or numbness. Psych: No SI or HI  ____________________________________________   PHYSICAL EXAM:  VITAL SIGNS: ED Triage Vitals [08/23/16 1048]  Enc  Vitals Group     BP (!) 145/86     Pulse Rate 84     Resp (!) 22     Temp 98.8 F (37.1 C)     Temp Source Oral     SpO2 98 %     Weight 172 lb (78 kg)     Height 6\' 4"  (1.93 m)     Head Circumference      Peak Flow      Pain Score 9     Pain Loc      Pain Edu?      Excl. in [place]?     Constitutional: Alert and oriented. Well appearing and in no apparent distress. HEENT:      Head: Normocephalic and atraumatic.         Eyes: Conjunctivae are normal. Sclera is non-icteric. EOMI. PERRL      Mouth/Throat: Mucous membranes are moist.       Neck: Supple with no signs of meningismus. Cardiovascular: Regular rate and rhythm. No murmurs, gallops, or rubs. 2+ symmetrical distal pulses are present in all extremities. No JVD. Respiratory: Normal respiratory effort. Lungs are clear to auscultation bilaterally. No wheezes, crackles, or rhonchi.  Gastrointestinal: Soft, Tender with localized guarding in the epigastric region, slight tenderness to palpation of the right upper quadrant with negative Murphy sign, and non distended with positive bowel sounds. No rebound or guarding. Genitourinary: No CVA tenderness. Musculoskeletal: Nontender with normal range of motion in all extremities. No edema, cyanosis, or erythema of extremities. Neurologic: Normal speech and language. Face is symmetric. Moving all extremities. No gross focal neurologic deficits are appreciated. Skin: Skin is warm, dry and intact. No rash noted. Psychiatric: Mood and affect are normal. Speech and behavior are normal.  ____________________________________________   LABS (all labs ordered are listed, but only abnormal results are displayed)  Labs Reviewed  BASIC METABOLIC PANEL - Abnormal; Notable for the following:       Result Value   Chloride 99 (*)    Glucose, Bld 145 (*)    All other components within normal limits  CBC - Abnormal; Notable for the following:    WBC 16.8 (*)    All other components within normal  limits  HEPATIC FUNCTION PANEL - Abnormal; Notable for the following:    Total Protein 8.2 (*)    AST 118 (*)    Total Bilirubin 1.5 (*)    Bilirubin, Direct 0.6 (*)    All other components within normal limits  LIPASE, BLOOD - Abnormal; Notable for the following:    Lipase 1,232 (*)    All other components within normal limits  TROPONIN I   ____________________________________________  EKG  ED ECG REPORT I, Rudene Re, the attending physician, personally viewed and interpreted this ECG.  Normal sinus rhythm, rate of 91, normal intervals, normal axis, no ST elevations or depressions.  ____________________________________________  RADIOLOGY  RUQ [place]: Mild common bile duct prominence. The distal duct cannot be visualized due to overlying bowel gas. This could be further evaluated with MRCP if felt clinically indicated.  Focal gallbladder wall thickening near the gallbladder  fundus. No visible stones.  Fatty liver. ____________________________________________   PROCEDURES  Procedure(s) performed: None Procedures Critical Care performed:  None ____________________________________________   INITIAL IMPRESSION / ASSESSMENT AND PLAN / ED COURSE   66 y.o. male with history of hyperlipidemia who presents for evaluation of epigastric abdominal pain since 7AM associated with nausea. Patient has a history of daily drinking. He has significant tenderness with localized guarding in the epigastric region with no elevated lipase of 1200 and concerning for pancreatitis. LFTs and T bili is slightly elevated. We'll order a right upper quadrant ultrasound to rule out gallstone pancreatitis. Discussed with patient that my concern is that this probably alcohol-induced. We'll give IV fluids, IV morphine, and IV Zofran for symptom relief. Troponin and EKG with no evidence of ischemia  Clinical Course as of Aug 24 1434  Fri Aug 23, 2016  1423 [place] with evidence of gallstones. With  elevated lipase at 1200 and significant leukocytosis, US showing inflamed gallbladder, will admit for obs  [CV]    Clinical Course User Index [CV] Rudene Re, MD    Pertinent labs & imaging results that were available during my care of the patient were reviewed by me and considered in my medical decision making (see chart for details).    ____________________________________________   FINAL CLINICAL IMPRESSION(S) / ED DIAGNOSES  Final diagnoses:  RUQ abdominal pain  Alcohol-induced acute pancreatitis, unspecified complication status      NEW MEDICATIONS STARTED DURING THIS VISIT:  New Prescriptions   ONDANSETRON (ZOFRAN) 4 MG TABLET    Take 1 tablet (4 mg total) by mouth every 8 (eight) hours as needed for nausea or vomiting.   OXYCODONE-ACETAMINOPHEN (ROXICET) 5-325 MG TABLET    Take 1 tablet by mouth every 6 (six) hours as needed.     Note:  This document was prepared using Dragon voice recognition software and may include unintentional dictation errors.    Rudene Re, MD 08/23/16 1436

## 2016-08-23 NOTE — ED Triage Notes (Signed)
Patient presents to ED via POV from home with c/o CP that woke him from his sleep. Patient describes it as a heaviness. Patient also c/o nausea. Daily drinker, 2-3 beers a day. Last drink was last night. Patient is pale in color. A&O x4.

## 2016-08-23 NOTE — Progress Notes (Signed)
Pt arrived from the ER at approx 1700. Pt is alert and oriented, arrived in wheelchair and ambulated to the bed. Wife is present. Pt is placed on telebox, pt is on room air, lungs are clear bilat, monitor is showing Sr. Pt stated that he has some soreness to his abdomen rated 4/10, has no nausea at this time. Pt denied difficulty urinating, denied difficulty with bm's. piv #20 intact to l fa with site free of redness and swelling. Iv ns hung at 177mls/hr per md order. Pt is aware of npo status. Pt is oriented to room and call bell.

## 2016-08-23 NOTE — ED Notes (Signed)
Pt given warm blanket.

## 2016-08-23 NOTE — ED Notes (Signed)
Pt stating that he began with CP around 730 this morning. Pt stating that it was tight pressure. Pt stating that he is just feeling sore at this time. Pt stating that this morning he was diaphoretic with the pain and family is stating that pt was "doubled over" with pain. Pt stating that he has had 3 times he felt nauseated but has had no vomiting. Pt stating that he was SOB from pain earlier but not at this time. Pt is unsure if he has been running a fever. Pt stating "I ate a real spicy dinner last night." Pt in NAD at this time. Family at bedside.

## 2016-08-23 NOTE — ED Notes (Signed)
Pt was given ginger ale and crackers for PO challenge

## 2016-08-23 NOTE — H&P (Addendum)
[facility] at [place] NAME: Dylan Long    MR#:  [id number]  DATE OF BIRTH:  10-11-1950  DATE OF ADMISSION:  08/23/2016  PRIMARY CARE PHYSICIAN: Jerlyn Ly, MD   REQUESTING/REFERRING PHYSICIAN:  Dr Alfred Levins  CHIEF COMPLAINT:   Abdominal pain HISTORY OF PRESENT ILLNESS:  Dylan Long  is a 66 y.o. male with a known history of Etoh abuse with aortic Stenosis who presents with midepigastric abdominal pain asociated with nausea and vomiting. Patient reports at 4:00 AM today these symptoms suddenly appeared. He continues to have 4/10 abdominal pain which has improved with pain medications. His lipase level is elevated and ultrasound of the ABDOMEN does not show gallstone disease. He drinks 4 cans of beer a day.  PAST MEDICAL HISTORY:   Past Medical History:  Diagnosis Date  . Aortic stenosis   . Aortic valve sclerosis    with functionally bicuspid aortic valve  . Dyslipidemia     PAST SURGICAL HISTORY:   Past Surgical History:  Procedure Laterality Date  . LUMBAR FUSION      SOCIAL HISTORY:   Social History  Substance Use Topics  . Smoking status: Former Smoker    Quit date: 09/26/1999  . Smokeless tobacco: Not on file  . Alcohol use Yes     Comment: 3-4 beers (12 oz) a day    FAMILY HISTORY:   Family History  Problem Relation Age of Onset  . Stroke Mother   . Heart disease Father   . Heart attack Father   . Hyperlipidemia Brother     Dyslipidemia    DRUG ALLERGIES:  No Known Allergies  REVIEW OF SYSTEMS:   Review of Systems  Constitutional: Negative.  Negative for chills, fever and malaise/fatigue.  HENT: Negative.  Negative for ear discharge, ear pain, hearing loss, nosebleeds and sore throat.   Eyes: Negative.  Negative for blurred vision and pain.  Respiratory: Negative.  Negative for cough, hemoptysis, shortness of breath and wheezing.   Cardiovascular: Negative.  Negative for chest pain, palpitations and leg  swelling.  Gastrointestinal: Positive for abdominal pain, nausea and vomiting. Negative for blood in stool and diarrhea.  Genitourinary: Negative.  Negative for dysuria.  Musculoskeletal: Negative.  Negative for back pain.  Skin: Negative.   Neurological: Negative for dizziness, tremors, speech change, focal weakness, seizures and headaches.  Endo/Heme/Allergies: Negative.  Does not bruise/bleed easily.  Psychiatric/Behavioral: Negative.  Negative for depression, hallucinations and suicidal ideas.    MEDICATIONS AT HOME:   Prior to Admission medications   Medication Sig Start Date End Date Taking? Authorizing Provider  CIALIS 20 MG tablet Take 20 mg by mouth as needed. Take 20 mg by mouth 30 minutes to 36 hours before sexual activity. 07/25/16  Yes Historical Provider, MD  cyclobenzaprine (FLEXERIL) 10 MG tablet Take 10 mg by mouth as needed.   Yes Historical Provider, MD  HYDROcodone-acetaminophen (NORCO/VICODIN) 5-325 MG tablet Take 1-2 tablets by mouth every 6 (six) hours as needed for pain. 06/27/16  Yes Historical Provider, MD  RABEprazole (ACIPHEX) 20 MG tablet Take 20 mg by mouth daily.   Yes Historical Provider, MD  rosuvastatin (CRESTOR) 5 MG tablet Take 5 mg by mouth daily.    Yes Historical Provider, MD  temazepam (RESTORIL) 30 MG capsule Take 30 mg by mouth at bedtime as needed for sleep. 08/15/16  Yes Historical Provider, MD      VITAL SIGNS:  Blood pressure (!) 157/99, pulse 78, temperature 98.8  F (37.1 C), temperature source Oral, resp. rate 12, height 6\' 4"  (1.93 m), weight 78 kg (172 lb), SpO2 99 %.  PHYSICAL EXAMINATION:   Physical Exam  Constitutional: He is oriented to person, place, and time and well-developed, well-nourished, and in no distress. No distress.  HENT:  Head: Normocephalic.  Eyes: No scleral icterus.  Neck: Normal range of motion. Neck supple. No JVD present. No tracheal deviation present.  Cardiovascular: Normal rate and regular rhythm.  Exam  reveals no gallop and no friction rub.   Murmur heard. Pulmonary/Chest: Effort normal and breath sounds normal. No respiratory distress. He has no wheezes. He has no rales. He exhibits no tenderness.  Abdominal: Soft. Bowel sounds are normal. He exhibits no distension and no mass. There is tenderness. There is no rebound and no guarding.  Musculoskeletal: Normal range of motion. He exhibits no edema.  Neurological: He is alert and oriented to person, place, and time.  Skin: Skin is warm. No rash noted. No erythema.  Psychiatric: Affect and judgment normal.      LABORATORY PANEL:   CBC  Recent Labs Lab 08/23/16 1048  WBC 16.8*  HGB 16.8  HCT 49.6  PLT 240   ------------------------------------------------------------------------------------------------------------------  Chemistries   Recent Labs Lab 08/23/16 1048  NA 135  K 4.2  CL 99*  CO2 29  GLUCOSE 145*  BUN 15  CREATININE 1.15  CALCIUM 9.8  AST 118*  ALT 61  ALKPHOS 53  BILITOT 1.5*   ------------------------------------------------------------------------------------------------------------------  Cardiac Enzymes  Recent Labs Lab 08/23/16 1048  TROPONINI <0.03   ------------------------------------------------------------------------------------------------------------------  RADIOLOGY:  Dg Chest 2 View  Result Date: 08/23/2016 CLINICAL DATA:  Chest pain EXAM: CHEST  2 VIEW COMPARISON:  01/31/2010 FINDINGS: Heart and mediastinal contours are within normal limits. No focal opacities or effusions. No acute bony abnormality. IMPRESSION: No active cardiopulmonary disease. Electronically Signed   By: Rolm Baptise M.D.   On: 08/23/2016 11:12   US Abdomen Limited Ruq  Result Date: 08/23/2016 CLINICAL DATA:  Right upper quadrant pain for 1 day EXAM: US ABDOMEN LIMITED - RIGHT UPPER QUADRANT COMPARISON:  None. FINDINGS: Gallbladder: Focal gallbladder wall thickening near the fundus measuring 6 mm. Remainder  the gallbladder wall is normal, 2.7 mm. No visible stones. Common bile duct: Diameter: Prominent, measuring 7-8 mm. The distal duct cannot be visualized due to overlying bowel gas. Liver: Increased echotexture compatible with fatty infiltration. No focal abnormality or biliary ductal dilatation. IMPRESSION: Mild common bile duct prominence. The distal duct cannot be visualized due to overlying bowel gas. This could be further evaluated with MRCP if felt clinically indicated. Focal gallbladder wall thickening near the gallbladder fundus. No visible stones. Fatty liver. Electronically Signed   By: Rolm Baptise M.D.   On: 08/23/2016 14:03    EKG:   Orders placed or performed during the hospital encounter of 08/23/16  . EKG 12-Lead  . EKG 12-Lead  . ED EKG within 10 minutes  . ED EKG within 10 minutes    IMPRESSION AND PLAN:   66 y/o male with Etoh abuse here with acute EtOH related pancreatitis.  1. Acute pancreatitis from EtOh abuse NPO IVF @ 150 mL/hr Repeat CBC and BMP in am as well as lipase Pain medications and antiemetics PRN   2. EtOh Abuse: CIWA protocol  3.HLD hold statin Patient is NPO  4. GERD: IV PPI for now  5. Elevated WBC from pancreatitis CXR negative for PNA Check UA CBC in am   All  the records are reviewed and case discussed with ED provider. Management plans discussed with the patient and he is in agreement  CODE STATUS: full  TOTAL TIME TAKING CARE OF THIS PATIENT: 45 minutes.    Maryhelen Lindler M.D on 08/23/2016 at 2:53 PM  Between 7am to 6pm - Pager - [phone]  After 6pm go to [email or web address] - password [email or web address]  [facility]  Office  [phone]  CC: Primary care physician; Jerlyn Ly, MD

## 2016-08-23 NOTE — ED Notes (Signed)
Pt returned from US

## 2016-08-24 LAB — BASIC METABOLIC PANEL
Anion gap: 3 — ABNORMAL LOW (ref 5–15)
BUN: 12 mg/dL (ref 6–20)
CO2: 26 mmol/L (ref 22–32)
Calcium: 8.3 mg/dL — ABNORMAL LOW (ref 8.9–10.3)
Chloride: 105 mmol/L (ref 101–111)
Creatinine, Ser: 0.92 mg/dL (ref 0.61–1.24)
GFR calc Af Amer: >60 mL/min (ref >60)
GFR calc non Af Amer: >60 mL/min (ref >60)
Glucose, Bld: 105 mg/dL — ABNORMAL HIGH (ref 65–99)
Potassium: 3.8 mmol/L (ref 3.5–5.1)
Sodium: 134 mmol/L — ABNORMAL LOW (ref 135–145)

## 2016-08-24 LAB — CBC
HCT: 39.6 % — ABNORMAL LOW (ref 40.0–52.0)
Hemoglobin: 13.9 g/dL (ref 13.0–18.0)
MCH: 33.5 pg (ref 26.0–34.0)
MCHC: 35.1 g/dL (ref 32.0–36.0)
MCV: 95.5 fL (ref 80.0–100.0)
Platelets: 172 10*3/uL (ref 150–440)
RBC: 4.15 MIL/uL — ABNORMAL LOW (ref 4.40–5.90)
RDW: 13 % (ref 11.5–14.5)
WBC: 9.8 10*3/uL (ref 3.8–10.6)

## 2016-08-24 LAB — LIPASE, BLOOD: Lipase: 189 U/L — ABNORMAL HIGH (ref 11–51)

## 2016-08-24 NOTE — Progress Notes (Signed)
Pt tolerated diet with no N/V or abdominal pain. Discharged to home with wife. Reviewed D/C instructions with pt, will schedule f/u with PCP on Monday.

## 2016-08-24 NOTE — Discharge Summary (Signed)
[facility] at [place] NAME: Dylan Long    MR#:  [id number]  DATE OF BIRTH:  [birth year]  DATE OF ADMISSION:  08/23/2016 ADMITTING PHYSICIAN: Bettey Costa, MD  DATE OF DISCHARGE: 08/24/2016  PRIMARY CARE PHYSICIAN: Jerlyn Ly, MD    ADMISSION DIAGNOSIS:  RUQ abdominal pain [R10.11] Alcohol-induced acute pancreatitis, unspecified complication status [T24.58]  DISCHARGE DIAGNOSIS:  Active Problems:   Pancreatitis   SECONDARY DIAGNOSIS:   Past Medical History:  Diagnosis Date  . Aortic stenosis   . Aortic valve sclerosis    with functionally bicuspid aortic valve  . Dyslipidemia     HOSPITAL COURSE:   66 y/o male with Etoh abuse here with acute EtOH related pancreatitis.  1. Acute pancreatitis from EtOh abuse Patient symptoms improved with IV fluids and supportive management. Lipase level is back down to 189 from 1232. Patient tolerated diet. Patient was advised to stop drinking EtOH.  2. EtOh Abuse: Patient had uneventful detox.  3.HLD: Patient may resume statin  4. GERD: Continue AcipHex 5. Elevated WBC from pancreatitis CBC is improved.  DISCHARGE CONDITIONS AND DIET:   Stable for discharge on regular diet  CONSULTS OBTAINED:    DRUG ALLERGIES:  No Known Allergies  DISCHARGE MEDICATIONS:   Current Discharge Medication List    CONTINUE these medications which have NOT CHANGED   Details  CIALIS 20 MG tablet Take 20 mg by mouth as needed. Take 20 mg by mouth 30 minutes to 36 hours before sexual activity. Refills: 3    cyclobenzaprine (FLEXERIL) 10 MG tablet Take 10 mg by mouth as needed.    HYDROcodone-acetaminophen (NORCO/VICODIN) 5-325 MG tablet Take 1-2 tablets by mouth every 6 (six) hours as needed for pain. Refills: 0    RABEprazole (ACIPHEX) 20 MG tablet Take 20 mg by mouth daily.    rosuvastatin (CRESTOR) 5 MG tablet Take 5 mg by mouth daily.     temazepam (RESTORIL) 30 MG capsule Take 30 mg  by mouth at bedtime as needed for sleep. Refills: 4          Today   CHIEF COMPLAINT:   Patient is doing well this morning. Denies abdominal pain, nausea or vomiting.   VITAL SIGNS:  Blood pressure 124/73, pulse 65, temperature 98.9 F (37.2 C), temperature source Oral, resp. rate 16, height 6\' 4"  (1.93 m), weight 78 kg (172 lb), SpO2 94 %.   REVIEW OF SYSTEMS:  Review of Systems  Constitutional: Negative.  Negative for chills, fever and malaise/fatigue.  HENT: Negative.  Negative for ear discharge, ear pain, hearing loss, nosebleeds and sore throat.   Eyes: Negative.  Negative for blurred vision and pain.  Respiratory: Negative.  Negative for cough, hemoptysis, shortness of breath and wheezing.   Cardiovascular: Negative.  Negative for chest pain, palpitations and leg swelling.  Gastrointestinal: Negative.  Negative for abdominal pain, blood in stool, diarrhea, nausea and vomiting.  Genitourinary: Negative.  Negative for dysuria.  Musculoskeletal: Negative.  Negative for back pain.  Skin: Negative.   Neurological: Negative for dizziness, tremors, speech change, focal weakness, seizures and headaches.  Endo/Heme/Allergies: Negative.  Does not bruise/bleed easily.  Psychiatric/Behavioral: Negative.  Negative for depression, hallucinations and suicidal ideas.     PHYSICAL EXAMINATION:  GENERAL:  66 y.o.-year-old patient lying in the bed with no acute distress.  NECK:  Supple, no jugular venous distention. No thyroid enlargement, no tenderness.  LUNGS: Normal breath sounds bilaterally, no wheezing, rales,rhonchi  No use of accessory  muscles of respiration.  CARDIOVASCULAR: S1, S2 normal. No murmurs, rubs, or gallops.  ABDOMEN: Soft, non-tender, non-distended. Bowel sounds present. No organomegaly or mass.  EXTREMITIES: No pedal edema, cyanosis, or clubbing.  PSYCHIATRIC: The patient is alert and oriented x 3.  SKIN: No obvious rash, lesion, or ulcer.   DATA REVIEW:    CBC  Recent Labs Lab 08/24/16 0359  WBC 9.8  HGB 13.9  HCT 39.6*  PLT 172    Chemistries   Recent Labs Lab 08/23/16 1048 08/24/16 0359  NA 135 134*  K 4.2 3.8  CL 99* 105  CO2 29 26  GLUCOSE 145* 105*  BUN 15 12  CREATININE 1.15 0.92  CALCIUM 9.8 8.3*  AST 118*  --   ALT 61  --   ALKPHOS 53  --   BILITOT 1.5*  --     Cardiac Enzymes  Recent Labs Lab 08/23/16 1048  TROPONINI <0.03    Microbiology Results  @[email or web address] @  RADIOLOGY:  Dg Chest 2 View  Result Date: 08/23/2016 CLINICAL DATA:  Chest pain EXAM: CHEST  2 VIEW COMPARISON:  01/31/2010 FINDINGS: Heart and mediastinal contours are within normal limits. No focal opacities or effusions. No acute bony abnormality. IMPRESSION: No active cardiopulmonary disease. Electronically Signed   By: Rolm Baptise M.D.   On: 08/23/2016 11:12   US Abdomen Limited Ruq  Result Date: 08/23/2016 CLINICAL DATA:  Right upper quadrant pain for 1 day EXAM: US ABDOMEN LIMITED - RIGHT UPPER QUADRANT COMPARISON:  None. FINDINGS: Gallbladder: Focal gallbladder wall thickening near the fundus measuring 6 mm. Remainder the gallbladder wall is normal, 2.7 mm. No visible stones. Common bile duct: Diameter: Prominent, measuring 7-8 mm. The distal duct cannot be visualized due to overlying bowel gas. Liver: Increased echotexture compatible with fatty infiltration. No focal abnormality or biliary ductal dilatation. IMPRESSION: Mild common bile duct prominence. The distal duct cannot be visualized due to overlying bowel gas. This could be further evaluated with MRCP if felt clinically indicated. Focal gallbladder wall thickening near the gallbladder fundus. No visible stones. Fatty liver. Electronically Signed   By: Rolm Baptise M.D.   On: 08/23/2016 14:03      Current Discharge Medication List    CONTINUE these medications which have NOT CHANGED   Details  CIALIS 20 MG tablet Take 20 mg by mouth as needed. Take 20 mg by mouth 30 minutes  to 36 hours before sexual activity. Refills: 3    cyclobenzaprine (FLEXERIL) 10 MG tablet Take 10 mg by mouth as needed.    HYDROcodone-acetaminophen (NORCO/VICODIN) 5-325 MG tablet Take 1-2 tablets by mouth every 6 (six) hours as needed for pain. Refills: 0    RABEprazole (ACIPHEX) 20 MG tablet Take 20 mg by mouth daily.    rosuvastatin (CRESTOR) 5 MG tablet Take 5 mg by mouth daily.     temazepam (RESTORIL) 30 MG capsule Take 30 mg by mouth at bedtime as needed for sleep. Refills: 4         Management plans discussed with the patient and he is in agreement. Stable for discharge home  Patient should follow up with pcp  CODE STATUS:     Code Status Orders        Start     Ordered   08/23/16 1658  Full code  Continuous     08/23/16 1657    Code Status History    Date Active Date Inactive Code Status Order ID Comments User Context   This patient  has a current code status but no historical code status.      TOTAL TIME TAKING CARE OF THIS PATIENT: 37  minutes.    Note: This dictation was prepared with Dragon dictation along with smaller phrase technology. Any transcriptional errors that result from this process are unintentional.  MODY, SITAL M.D on 08/24/2016 at 9:29 AM  Between 7am to 6pm - Pager - [phone] After 6pm go to [email or web address] - password [email or web address]  [facility]  Office  [phone]  CC: Primary care physician; Jerlyn Ly, MD

## 2016-08-28 DIAGNOSIS — K829 Disease of gallbladder, unspecified: Secondary | ICD-10-CM | POA: Diagnosis not present

## 2016-08-28 DIAGNOSIS — K859 Acute pancreatitis without necrosis or infection, unspecified: Secondary | ICD-10-CM | POA: Diagnosis not present

## 2016-08-28 DIAGNOSIS — Z6821 Body mass index (BMI) 21.0-21.9, adult: Secondary | ICD-10-CM | POA: Diagnosis not present

## 2016-08-28 DIAGNOSIS — K219 Gastro-esophageal reflux disease without esophagitis: Secondary | ICD-10-CM | POA: Diagnosis not present

## 2016-08-28 DIAGNOSIS — E784 Other hyperlipidemia: Secondary | ICD-10-CM | POA: Diagnosis not present

## 2016-08-28 DIAGNOSIS — R634 Abnormal weight loss: Secondary | ICD-10-CM | POA: Diagnosis not present

## 2016-09-11 DIAGNOSIS — Z125 Encounter for screening for malignant neoplasm of prostate: Secondary | ICD-10-CM | POA: Diagnosis not present

## 2016-09-11 DIAGNOSIS — E538 Deficiency of other specified B group vitamins: Secondary | ICD-10-CM | POA: Diagnosis not present

## 2016-09-11 DIAGNOSIS — E784 Other hyperlipidemia: Secondary | ICD-10-CM | POA: Diagnosis not present

## 2016-09-11 DIAGNOSIS — R7301 Impaired fasting glucose: Secondary | ICD-10-CM | POA: Diagnosis not present

## 2016-09-18 DIAGNOSIS — E538 Deficiency of other specified B group vitamins: Secondary | ICD-10-CM | POA: Diagnosis not present

## 2016-09-18 DIAGNOSIS — Z1389 Encounter for screening for other disorder: Secondary | ICD-10-CM | POA: Diagnosis not present

## 2016-09-18 DIAGNOSIS — Z0000 Encounter for general adult medical examination without abnormal findings: Secondary | ICD-10-CM | POA: Diagnosis not present

## 2016-09-18 DIAGNOSIS — K829 Disease of gallbladder, unspecified: Secondary | ICD-10-CM | POA: Diagnosis not present

## 2016-09-18 DIAGNOSIS — I350 Nonrheumatic aortic (valve) stenosis: Secondary | ICD-10-CM | POA: Diagnosis not present

## 2016-09-18 DIAGNOSIS — F3289 Other specified depressive episodes: Secondary | ICD-10-CM | POA: Diagnosis not present

## 2016-09-18 DIAGNOSIS — H9193 Unspecified hearing loss, bilateral: Secondary | ICD-10-CM | POA: Diagnosis not present

## 2016-09-18 DIAGNOSIS — Z6821 Body mass index (BMI) 21.0-21.9, adult: Secondary | ICD-10-CM | POA: Diagnosis not present

## 2016-09-18 DIAGNOSIS — R634 Abnormal weight loss: Secondary | ICD-10-CM | POA: Diagnosis not present

## 2016-09-18 DIAGNOSIS — K859 Acute pancreatitis without necrosis or infection, unspecified: Secondary | ICD-10-CM | POA: Diagnosis not present

## 2016-09-18 DIAGNOSIS — M79671 Pain in right foot: Secondary | ICD-10-CM | POA: Diagnosis not present

## 2016-09-18 DIAGNOSIS — N528 Other male erectile dysfunction: Secondary | ICD-10-CM | POA: Diagnosis not present

## 2016-09-20 DIAGNOSIS — F329 Major depressive disorder, single episode, unspecified: Secondary | ICD-10-CM | POA: Diagnosis not present

## 2016-09-20 DIAGNOSIS — K852 Alcohol induced acute pancreatitis without necrosis or infection: Secondary | ICD-10-CM | POA: Diagnosis not present

## 2016-09-20 DIAGNOSIS — K219 Gastro-esophageal reflux disease without esophagitis: Secondary | ICD-10-CM | POA: Diagnosis not present

## 2016-10-28 DIAGNOSIS — N486 Induration penis plastica: Secondary | ICD-10-CM | POA: Diagnosis not present

## 2017-01-30 DIAGNOSIS — H2513 Age-related nuclear cataract, bilateral: Secondary | ICD-10-CM | POA: Diagnosis not present

## 2017-09-19 DIAGNOSIS — L821 Other seborrheic keratosis: Secondary | ICD-10-CM | POA: Diagnosis not present

## 2017-10-27 DIAGNOSIS — Z125 Encounter for screening for malignant neoplasm of prostate: Secondary | ICD-10-CM | POA: Diagnosis not present

## 2017-10-27 DIAGNOSIS — E7849 Other hyperlipidemia: Secondary | ICD-10-CM | POA: Diagnosis not present

## 2017-10-27 DIAGNOSIS — R7301 Impaired fasting glucose: Secondary | ICD-10-CM | POA: Diagnosis not present

## 2017-10-27 DIAGNOSIS — E538 Deficiency of other specified B group vitamins: Secondary | ICD-10-CM | POA: Diagnosis not present

## 2017-11-10 DIAGNOSIS — M79671 Pain in right foot: Secondary | ICD-10-CM | POA: Diagnosis not present

## 2017-11-10 DIAGNOSIS — Z6823 Body mass index (BMI) 23.0-23.9, adult: Secondary | ICD-10-CM | POA: Diagnosis not present

## 2017-11-10 DIAGNOSIS — R7301 Impaired fasting glucose: Secondary | ICD-10-CM | POA: Diagnosis not present

## 2017-11-10 DIAGNOSIS — I350 Nonrheumatic aortic (valve) stenosis: Secondary | ICD-10-CM | POA: Diagnosis not present

## 2017-11-10 DIAGNOSIS — Z0000 Encounter for general adult medical examination without abnormal findings: Secondary | ICD-10-CM | POA: Diagnosis not present

## 2017-11-10 DIAGNOSIS — Z1389 Encounter for screening for other disorder: Secondary | ICD-10-CM | POA: Diagnosis not present

## 2017-11-10 DIAGNOSIS — H9193 Unspecified hearing loss, bilateral: Secondary | ICD-10-CM | POA: Diagnosis not present

## 2017-11-10 DIAGNOSIS — N528 Other male erectile dysfunction: Secondary | ICD-10-CM | POA: Diagnosis not present

## 2017-11-10 DIAGNOSIS — E7849 Other hyperlipidemia: Secondary | ICD-10-CM | POA: Diagnosis not present

## 2017-11-10 DIAGNOSIS — E538 Deficiency of other specified B group vitamins: Secondary | ICD-10-CM | POA: Diagnosis not present

## 2017-11-10 DIAGNOSIS — Z87438 Personal history of other diseases of male genital organs: Secondary | ICD-10-CM | POA: Diagnosis not present

## 2017-11-10 DIAGNOSIS — K219 Gastro-esophageal reflux disease without esophagitis: Secondary | ICD-10-CM | POA: Diagnosis not present

## 2017-11-14 DIAGNOSIS — Z1212 Encounter for screening for malignant neoplasm of rectum: Secondary | ICD-10-CM | POA: Diagnosis not present

## 2018-02-03 DIAGNOSIS — K859 Acute pancreatitis without necrosis or infection, unspecified: Secondary | ICD-10-CM | POA: Diagnosis not present

## 2018-02-11 ENCOUNTER — Other Ambulatory Visit: Payer: Self-pay

## 2018-02-11 ENCOUNTER — Encounter: Payer: Self-pay | Admitting: Urology

## 2018-02-11 ENCOUNTER — Ambulatory Visit: Payer: PPO | Admitting: Urology

## 2018-02-11 VITALS — BP 134/76 | HR 76 | Ht 76.0 in | Wt 184.7 lb

## 2018-02-11 DIAGNOSIS — N529 Male erectile dysfunction, unspecified: Secondary | ICD-10-CM

## 2018-02-11 DIAGNOSIS — Z1212 Encounter for screening for malignant neoplasm of rectum: Secondary | ICD-10-CM | POA: Diagnosis not present

## 2018-02-11 DIAGNOSIS — Z1211 Encounter for screening for malignant neoplasm of colon: Secondary | ICD-10-CM | POA: Diagnosis not present

## 2018-02-11 MED ORDER — TADALAFIL 20 MG PO TABS: 20.0000 mg | ORAL_TABLET | Freq: Every day | ORAL | 0 refills | Status: DC | PRN

## 2018-02-11 NOTE — Progress Notes (Addendum)
02/11/2018 2:37 PM   Dylan Long 1951-03-27 [id number]  Referring provider: Crist Infante, MD [address] [place], [place] [address]  CC: Erectile dysfunction and Peyronie's disease  HPI: I had the pleasure of seeing Mr. Goerner in urology clinic today in consultation for erectile dysfunction with penile curvature from Dr. Joylene Draft.  He is a 67 year old relatively healthy male who has had a long-term erectile dysfunction as well as noted some penile curvature.  He has noted some improvement in his erections with PDE 5 inhibitors including Viagra and Cialis.  He also noted a curvature of approximately 30 degrees and was previously followed by Dr. Jeffie Pollock at [facility] urology.  He lives closer to [place] and is transferring his care to [place] at [facility].  They had discussed Xiaflex previously, however the patient has noted a significant improvement in his curvature with use of a vacuum device alone.  He does report some very mild bruising on the glands with vacuum device use.  He currently estimates his curvature is approximately 15 degrees.   PMH: Past Medical History:  Diagnosis Date  . Aortic stenosis   . Aortic valve sclerosis    with functionally bicuspid aortic valve  . Dyslipidemia     Surgical History: Past Surgical History:  Procedure Laterality Date  . LUMBAR FUSION       Allergies: No Known Allergies  Family History: Family History  Problem Relation Age of Onset  . Stroke Mother   . Heart disease Father   . Heart attack Father   . Hyperlipidemia Brother        Dyslipidemia    Social History:  reports that he quit smoking about 18 years ago. His smoking use included cigarettes. He has never used smokeless tobacco. He reports that he drinks alcohol. He reports that he has current or past drug history. Drug: Marijuana.  ROS: Please see flowsheet from today's date for complete review of systems.  Physical Exam: BP 134/76   Pulse 76   Ht 6\' 4"  (1.93 m)    Wt 184 lb 11.2 oz (83.8 kg)   BMI 22.48 kg/m    Constitutional:  Alert and oriented, No acute distress. Cardiovascular: No clubbing, cyanosis, or edema. Respiratory: Normal respiratory effort, no increased work of breathing. GI: Abdomen is soft, nontender, nondistended, no abdominal masses GU: No CVA tenderness, mild bruising on dorsal glans, no penile shaft lesions.  Unable to palpate a definite plaque at the left base of the penis. DRE: Deferred Lymph: No cervical or inguinal lymphadenopathy. Skin: No rashes, bruises or suspicious lesions. Neurologic: Grossly intact, no focal deficits, moving all 4 extremities. Psychiatric: Normal mood and affect.  Laboratory Data: None to review  Pertinent Imaging: None to review  Assessment & Plan:   In summary, Mr. Rivet is a 67 year old male with erectile dysfunction that is currently well managed with PDE 5 inhibitors as well as vacuum device.  He has very mild curvature that is nonpainful and minimally bothersome to him or his partner.  He estimates his curvature is approximately 15 degrees.  We discussed options for management of erectile dysfunction at length including the PDE 5 inhibitors, penile injections, vacuum device, MUSE, and penile prosthesis.  I encouraged him to continue using the vacuum device and performing penile stretching exercises, which will likely continue to improve his very subtle curvature.  Prescription for tadalafil 20 mg on demand provided, goodRx coupon printed Follow-up in 6 months  Billey Co, MD  [facility]  [address], [place], [place] [address] [phone]

## 2018-02-13 ENCOUNTER — Inpatient Hospital Stay
Admission: EM | Admit: 2018-02-13 | Discharge: 2018-02-15 | DRG: 439 | Disposition: A | Discharge status: Home / Self Care | Payer: PPO | Attending: Internal Medicine | Admitting: Internal Medicine

## 2018-02-13 ENCOUNTER — Other Ambulatory Visit: Payer: Self-pay

## 2018-02-13 DIAGNOSIS — E785 Hyperlipidemia, unspecified: Secondary | ICD-10-CM | POA: Diagnosis not present

## 2018-02-13 DIAGNOSIS — F1010 Alcohol abuse, uncomplicated: Secondary | ICD-10-CM | POA: Diagnosis present

## 2018-02-13 DIAGNOSIS — R932 Abnormal findings on diagnostic imaging of liver and biliary tract: Secondary | ICD-10-CM | POA: Diagnosis not present

## 2018-02-13 DIAGNOSIS — Q231 Congenital insufficiency of aortic valve: Secondary | ICD-10-CM

## 2018-02-13 DIAGNOSIS — K838 Other specified diseases of biliary tract: Secondary | ICD-10-CM | POA: Diagnosis not present

## 2018-02-13 DIAGNOSIS — K828 Other specified diseases of gallbladder: Secondary | ICD-10-CM | POA: Diagnosis not present

## 2018-02-13 DIAGNOSIS — Z981 Arthrodesis status: Secondary | ICD-10-CM

## 2018-02-13 DIAGNOSIS — Z79899 Other long term (current) drug therapy: Secondary | ICD-10-CM

## 2018-02-13 DIAGNOSIS — K859 Acute pancreatitis without necrosis or infection, unspecified: Principal | ICD-10-CM | POA: Diagnosis present

## 2018-02-13 DIAGNOSIS — K811 Chronic cholecystitis: Secondary | ICD-10-CM | POA: Diagnosis not present

## 2018-02-13 DIAGNOSIS — I10 Essential (primary) hypertension: Secondary | ICD-10-CM | POA: Diagnosis not present

## 2018-02-13 DIAGNOSIS — Z87891 Personal history of nicotine dependence: Secondary | ICD-10-CM | POA: Diagnosis not present

## 2018-02-13 DIAGNOSIS — F1011 Alcohol abuse, in remission: Secondary | ICD-10-CM | POA: Diagnosis not present

## 2018-02-13 HISTORY — DX: Acute pancreatitis without necrosis or infection, unspecified: K85.90

## 2018-02-13 LAB — LIPASE, BLOOD: Lipase: 486 U/L — ABNORMAL HIGH (ref 11–51)

## 2018-02-13 LAB — COMPREHENSIVE METABOLIC PANEL
ALT: 161 U/L — ABNORMAL HIGH (ref 0–44)
AST: 102 U/L — ABNORMAL HIGH (ref 15–41)
Albumin: 4.5 g/dL (ref 3.5–5.0)
Alkaline Phosphatase: 102 U/L (ref 38–126)
Anion gap: 8 (ref 5–15)
BUN: 9 mg/dL (ref 8–23)
CO2: 28 mmol/L (ref 22–32)
Calcium: 9.6 mg/dL (ref 8.9–10.3)
Chloride: 98 mmol/L (ref 98–111)
Creatinine, Ser: 1.17 mg/dL (ref 0.61–1.24)
GFR calc Af Amer: >60 mL/min (ref >60)
GFR calc non Af Amer: >60 mL/min (ref >60)
Glucose, Bld: 127 mg/dL — ABNORMAL HIGH (ref 70–99)
Potassium: 5.1 mmol/L (ref 3.5–5.1)
Sodium: 134 mmol/L — ABNORMAL LOW (ref 135–145)
Total Bilirubin: 1 mg/dL (ref 0.3–1.2)
Total Protein: 8.1 g/dL (ref 6.5–8.1)

## 2018-02-13 LAB — CBC
HCT: 49.1 % (ref 39.0–52.0)
Hemoglobin: 16.7 g/dL (ref 13.0–17.0)
MCH: 32.7 pg (ref 26.0–34.0)
MCHC: 34 g/dL (ref 30.0–36.0)
MCV: 96.1 fL (ref 80.0–100.0)
Platelets: 182 10*3/uL (ref 150–400)
RBC: 5.11 MIL/uL (ref 4.22–5.81)
RDW: 12.3 % (ref 11.5–15.5)
WBC: 8.7 10*3/uL (ref 4.0–10.5)
nRBC: 0 % (ref 0.0–0.2)

## 2018-02-13 LAB — URINALYSIS, COMPLETE (UACMP) WITH MICROSCOPIC
Bacteria, UA: NONE SEEN
Bilirubin Urine: NEGATIVE
Glucose, UA: NEGATIVE mg/dL
Ketones, ur: NEGATIVE mg/dL
Leukocytes, UA: NEGATIVE
Nitrite: NEGATIVE
Protein, ur: NEGATIVE mg/dL
Specific Gravity, Urine: 1.012 (ref 1.005–1.030)
WBC, UA: NONE SEEN WBC/hpf (ref 0–5)
pH: 6 (ref 5.0–8.0)

## 2018-02-13 MED ORDER — PANTOPRAZOLE SODIUM 40 MG PO TBEC
40.0000 mg | DELAYED_RELEASE_TABLET | Freq: Every day | ORAL | Status: DC
  Administered 2018-02-13: 40 mg via ORAL
  Filled 2018-02-13 (×2): qty 1

## 2018-02-13 MED ORDER — ENOXAPARIN SODIUM 40 MG/0.4ML ~~LOC~~ SOLN
40.0000 mg | SUBCUTANEOUS | Status: DC
  Administered 2018-02-13: 21:00:00 40 mg via SUBCUTANEOUS
  Filled 2018-02-13 (×2): qty 0.4

## 2018-02-13 MED ORDER — MORPHINE SULFATE (PF) 2 MG/ML IV SOLN
2.0000 mg | INTRAVENOUS | Status: DC | PRN
  Administered 2018-02-13 (×2): 2 mg via INTRAVENOUS
  Filled 2018-02-13 (×2): qty 1

## 2018-02-13 MED ORDER — THIAMINE HCL 100 MG/ML IJ SOLN
100.0000 mg | Freq: Every day | INTRAMUSCULAR | Status: DC
  Filled 2018-02-13 (×3): qty 1

## 2018-02-13 MED ORDER — TRAZODONE HCL 50 MG PO TABS: 50.0000 mg | ORAL_TABLET | Freq: Every evening | ORAL | Status: DC | PRN

## 2018-02-13 MED ORDER — MORPHINE SULFATE (PF) 4 MG/ML IV SOLN
4.0000 mg | INTRAVENOUS | Status: DC | PRN
  Administered 2018-02-15: 4 mg via INTRAVENOUS
  Filled 2018-02-13: qty 1

## 2018-02-13 MED ORDER — DOCUSATE SODIUM 100 MG PO CAPS
100.0000 mg | ORAL_CAPSULE | Freq: Two times a day (BID) | ORAL | Status: DC
  Filled 2018-02-13 (×3): qty 1

## 2018-02-13 MED ORDER — LORAZEPAM 2 MG/ML IJ SOLN: 1.0000 mg | Freq: Four times a day (QID) | INTRAMUSCULAR | Status: DC | PRN

## 2018-02-13 MED ORDER — TEMAZEPAM 15 MG PO CAPS
30.0000 mg | ORAL_CAPSULE | Freq: Every evening | ORAL | Status: DC | PRN
  Administered 2018-02-13 – 2018-02-14 (×2): 30 mg via ORAL
  Filled 2018-02-13 (×2): qty 2

## 2018-02-13 MED ORDER — POLYETHYLENE GLYCOL 3350 17 G PO PACK: 17.0000 g | PACK | Freq: Every day | ORAL | Status: DC | PRN

## 2018-02-13 MED ORDER — ONDANSETRON HCL 4 MG/2ML IJ SOLN
4.0000 mg | Freq: Once | INTRAMUSCULAR | Status: AC
  Administered 2018-02-13: 4 mg via INTRAVENOUS
  Filled 2018-02-13: qty 2

## 2018-02-13 MED ORDER — FOLIC ACID 1 MG PO TABS
1.0000 mg | ORAL_TABLET | Freq: Every day | ORAL | Status: DC
  Administered 2018-02-13: 17:00:00 1 mg via ORAL
  Filled 2018-02-13 (×2): qty 1

## 2018-02-13 MED ORDER — MORPHINE SULFATE (PF) 4 MG/ML IV SOLN
4.0000 mg | Freq: Once | INTRAVENOUS | Status: AC
  Administered 2018-02-13: 4 mg via INTRAVENOUS
  Filled 2018-02-13: qty 1

## 2018-02-13 MED ORDER — LORAZEPAM 1 MG PO TABS: 1.0000 mg | ORAL_TABLET | Freq: Four times a day (QID) | ORAL | Status: DC | PRN

## 2018-02-13 MED ORDER — ONDANSETRON HCL 4 MG PO TABS: 4.0000 mg | ORAL_TABLET | Freq: Four times a day (QID) | ORAL | Status: DC | PRN

## 2018-02-13 MED ORDER — ADULT MULTIVITAMIN W/MINERALS CH
1.0000 | ORAL_TABLET | Freq: Every day | ORAL | Status: DC
  Administered 2018-02-13: 17:00:00 1 via ORAL
  Filled 2018-02-13 (×2): qty 1

## 2018-02-13 MED ORDER — HYDROCODONE-ACETAMINOPHEN 5-325 MG PO TABS
1.0000 | ORAL_TABLET | Freq: Four times a day (QID) | ORAL | Status: DC | PRN
  Administered 2018-02-13: 1 via ORAL
  Filled 2018-02-13: qty 1

## 2018-02-13 MED ORDER — CYCLOBENZAPRINE HCL 10 MG PO TABS: 10.0000 mg | ORAL_TABLET | Freq: Three times a day (TID) | ORAL | Status: DC | PRN

## 2018-02-13 MED ORDER — INFLUENZA VAC SPLIT HIGH-DOSE 0.5 ML IM SUSY
0.5000 mL | PREFILLED_SYRINGE | INTRAMUSCULAR | Status: DC
  Filled 2018-02-13: qty 0.5

## 2018-02-13 MED ORDER — SODIUM CHLORIDE 0.9 % IV SOLN
1000.0000 mL | Freq: Once | INTRAVENOUS | Status: AC
Start: 2018-02-13 — End: 2018-02-13
  Administered 2018-02-13: 1000 mL via INTRAVENOUS

## 2018-02-13 MED ORDER — VITAMIN B-1 100 MG PO TABS
100.0000 mg | ORAL_TABLET | Freq: Every day | ORAL | Status: DC
  Administered 2018-02-13: 17:00:00 100 mg via ORAL
  Filled 2018-02-13 (×2): qty 1

## 2018-02-13 MED ORDER — SODIUM CHLORIDE 0.9 % IV SOLN
INTRAVENOUS | Status: DC
  Administered 2018-02-13 – 2018-02-15 (×4): via INTRAVENOUS

## 2018-02-13 MED ORDER — ONDANSETRON HCL 4 MG/2ML IJ SOLN: 4.0000 mg | Freq: Four times a day (QID) | INTRAMUSCULAR | Status: DC | PRN

## 2018-02-13 MED ORDER — METOPROLOL TARTRATE 5 MG/5ML IV SOLN: 5.0000 mg | Freq: Three times a day (TID) | INTRAVENOUS | Status: DC | PRN

## 2018-02-13 NOTE — ED Triage Notes (Signed)
Pt c/o aching/full feeling of the epigastric with nausea for the past 3 days. Denies vomiting or diarrhea. Pt states "Im having some pancreas problems.

## 2018-02-13 NOTE — Plan of Care (Signed)
Pt admitted from the ED.  VSS.  Pt c/o pain 10 out of 10 after consuming 90% of clear liquid tray.  Morphine given without improvement.  Pt reported mild nausea, no emesis.

## 2018-02-13 NOTE — ED Provider Notes (Signed)
[facility] Emergency Department Provider Note   ____________________________________________    I have reviewed the triage vital signs and the nursing notes.   HISTORY  Chief Complaint Abdominal Pain     HPI Dylan Long is a 67 y.o. male resents with complaints of epigastric pain.  Patient reports that this feels similar to prior episodes of pancreatitis.  He denies burning sensation that he thought was getting better until last night when it became severe, he broke out in a cold sweat.  He does not think that he has had fevers.  Positive nausea, no vomiting.  Ate a small amount this morning.  Symptoms started 3 days ago.  Denies recent alcohol use.   Past Medical History:  Diagnosis Date  . Aortic stenosis   . Aortic valve sclerosis    with functionally bicuspid aortic valve  . Dyslipidemia   . Pancreatitis     Patient Active Problem List   Diagnosis Date Noted  . Pancreatitis 08/23/2016  . Plantar fasciitis of right foot 01/27/2013  . Bicuspid aortic valve 09/26/2011  . Hyperlipidemia 09/26/2011    Past Surgical History:  Procedure Laterality Date  . LUMBAR FUSION      Prior to Admission medications   Medication Sig Start Date End Date Taking? Authorizing Provider  cyclobenzaprine (FLEXERIL) 10 MG tablet Take 10 mg by mouth 3 (three) times daily as needed for muscle spasms.    Yes [provider]  HYDROcodone-acetaminophen (NORCO/VICODIN) 5-325 MG tablet Take 1-2 tablets by mouth every 6 (six) hours as needed for pain. 06/27/16  Yes [provider]  pantoprazole (PROTONIX) 40 MG tablet Take 40 mg by mouth daily.   Yes [provider]  tadalafil (ADCIRCA/CIALIS) 20 MG tablet Take 1 tablet (20 mg total) by mouth daily as needed for erectile dysfunction. 02/11/18  Yes Billey Co, MD  temazepam (RESTORIL) 30 MG capsule Take 30 mg by mouth at bedtime as needed for sleep. 08/15/16  Yes [provider]       Allergies Patient has no known allergies.  Family History  Problem Relation Age of Onset  . Stroke Mother   . Heart disease Father   . Heart attack Father   . Hyperlipidemia Brother        Dyslipidemia    Social History Social History   Tobacco Use  . Smoking status: Former Smoker    Types: Cigarettes    Last attempt to quit: 09/26/1999    Years since quitting: 18.3  . Smokeless tobacco: Never Used  Substance Use Topics  . Alcohol use: Yes    Comment: 3-4 beers (12 oz) a day  . Drug use: Yes    Types: Marijuana    Review of Systems  Constitutional: No fever/chills Eyes: No visual changes.  ENT: No sore throat. Cardiovascular: Denies chest pain. Respiratory: Denies shortness of breath. Gastrointestinal: As above Genitourinary: Negative for dysuria. Musculoskeletal: Negative for back pain. Skin: Negative for rash. Neurological: Negative for headaches or weakness   ____________________________________________   PHYSICAL EXAM:  VITAL SIGNS: ED Triage Vitals [02/13/18 1208]  Enc Vitals Group     BP (!) 153/89     Pulse Rate 72     Resp 18     Temp 98.2 F (36.8 C)     Temp Source Oral     SpO2 98 %     Weight 83.5 kg (184 lb)     Height 1.93 m (6\' 4" )  Head Circumference      Peak Flow      Pain Score 10     Pain Loc      Pain Edu?      Excl. in [place]?     Constitutional: Alert and oriented. Eyes: Conjunctivae are normal.   Nose: No congestion/rhinnorhea. Mouth/Throat: Mucous membranes are moist.   Neck:  Painless ROM Cardiovascular: Normal rate, regular rhythm  Good peripheral circulation. Respiratory: Normal respiratory effort.  No retractions. Gastrointestinal: Tenderness to the epigastrium. No distention.  No CVA tenderness.  Musculoskeletal: .  Warm and well perfused Neurologic:  Normal speech and language. No gross focal neurologic deficits are appreciated.  Skin:  Skin is warm, dry and intact. No rash noted. Psychiatric: Mood and  affect are normal. Speech and behavior are normal.  ____________________________________________   LABS (all labs ordered are listed, but only abnormal results are displayed)  Labs Reviewed  LIPASE, BLOOD - Abnormal; Notable for the following components:      Result Value   Lipase 486 (*)    All other components within normal limits  COMPREHENSIVE METABOLIC PANEL - Abnormal; Notable for the following components:   Sodium 134 (*)    Glucose, Bld 127 (*)    AST 102 (*)    ALT 161 (*)    All other components within normal limits  URINALYSIS, COMPLETE (UACMP) WITH MICROSCOPIC - Abnormal; Notable for the following components:   Color, Urine YELLOW (*)    APPearance CLEAR (*)    Hgb urine dipstick SMALL (*)    All other components within normal limits  CBC   ____________________________________________  EKG  None ____________________________________________  RADIOLOGY   ____________________________________________   PROCEDURES  Procedure(s) performed: No  Procedures   Critical Care performed: No ____________________________________________   INITIAL IMPRESSION / ASSESSMENT AND PLAN / ED COURSE  Pertinent labs & imaging results that were available during my care of the patient were reviewed by me and considered in my medical decision making (see chart for details).  Patient presents with epigastric pain, mild nausea, no vomiting.  Symptoms consistent with prior episodes of pancreatitis, lipase is elevated consistent with pancreatitis.  We will give IV fluids, IV morphine, IV Zofran and admit to the hospitalist service    ____________________________________________   FINAL CLINICAL IMPRESSION(S) / ED DIAGNOSES  Final diagnoses:  Acute pancreatitis, unspecified complication status, unspecified pancreatitis type        Note:  This document was prepared using Dragon voice recognition software and may include unintentional dictation errors.    Lavonia Drafts, MD 02/13/18 1530

## 2018-02-13 NOTE — ED Notes (Signed)
ED TO INPATIENT HANDOFF REPORT  Name/Age/Gender Dylan Long 67 y.o. male  Code Status Code Status History    Date Active Date Inactive Code Status Order ID Comments User Context   08/23/2016 1657 08/24/2016 1450 Full Code [id number]  Bettey Costa, MD Inpatient      Home/SNF/Other Home  Chief Complaint abdominal pain  Level of Care/Admitting Diagnosis ED Disposition    ED Disposition Condition [place]: [place] [[id number]]  Level of Care: Med-Surg [16]  Diagnosis: Pancreatitis [202663]  Admitting Physician: Gorden Harms [[id number]]  Attending Physician: Gorden Harms [[id number]]  Estimated length of stay: past midnight tomorrow  Certification:: I certify this patient will need inpatient services for at least 2 midnights  PT Class (Do Not Modify): Inpatient [101]  PT Acc Code (Do Not Modify): Private [1]       Medical History Past Medical History:  Diagnosis Date  . Aortic stenosis   . Aortic valve sclerosis    with functionally bicuspid aortic valve  . Dyslipidemia   . Pancreatitis     Allergies No Known Allergies  IV Location/Drains/Wounds Patient Lines/Drains/Airways Status   Active Line/Drains/Airways    Name:   Placement date:   Placement time:   Site:   Days:   Peripheral IV 02/13/18 Right;Posterior Wrist   02/13/18    1422    Wrist   less than 1          Labs/Imaging Results for orders placed or performed during the hospital encounter of 02/13/18 (from the past 48 hour(s))  Lipase, blood     Status: Abnormal   Collection Time: 02/13/18 12:14 PM  Result Value Ref Range   Lipase 486 (H) 11 - 51 U/L    Comment: RESULT CONFIRMED BY MANUAL DILUTION DAS Performed at [facility], [address] [place], [place] [address]   Comprehensive metabolic panel     Status: Abnormal   Collection Time: 02/13/18 12:14 PM  Result Value Ref Range   Sodium 134 (L) 135 - 145 mmol/L   Potassium 5.1 3.5 -  5.1 mmol/L   Chloride 98 98 - 111 mmol/L   CO2 28 22 - 32 mmol/L   Glucose, Bld 127 (H) 70 - 99 mg/dL   BUN 9 8 - 23 mg/dL   Creatinine, Ser 1.17 0.61 - 1.24 mg/dL   Calcium 9.6 8.9 - 10.3 mg/dL   Total Protein 8.1 6.5 - 8.1 g/dL   Albumin 4.5 3.5 - 5.0 g/dL   AST 102 (H) 15 - 41 U/L   ALT 161 (H) 0 - 44 U/L   Alkaline Phosphatase 102 38 - 126 U/L   Total Bilirubin 1.0 0.3 - 1.2 mg/dL   GFR calc non Af Amer >60 >60 mL/min   GFR calc Af Amer >60 >60 mL/min    Comment: (NOTE) The eGFR has been calculated using the CKD EPI equation. This calculation has not been validated in all clinical situations. eGFR's persistently <60 mL/min signify possible Chronic Kidney Disease.    Anion gap 8 5 - 15    Comment: Performed at [facility], [address] [place],  [address]  CBC     Status: None   Collection Time: 02/13/18 12:14 PM  Result Value Ref Range   WBC 8.7 4.0 - 10.5 K/uL   RBC 5.11 4.22 - 5.81 MIL/uL   Hemoglobin 16.7 13.0 - 17.0 g/dL   HCT 49.1 39.0 - 52.0 %  MCV 96.1 80.0 - 100.0 fL   MCH 32.7 26.0 - 34.0 pg   MCHC 34.0 30.0 - 36.0 g/dL   RDW 12.3 11.5 - 15.5 %   Platelets 182 150 - 400 K/uL   nRBC 0.0 0.0 - 0.2 %    Comment: Performed at [facility], [address] [place], [place] [address]  Urinalysis, Complete w Microscopic     Status: Abnormal   Collection Time: 02/13/18 12:14 PM  Result Value Ref Range   Color, Urine YELLOW (A) YELLOW   APPearance CLEAR (A) CLEAR   Specific Gravity, Urine 1.012 1.005 - 1.030   pH 6.0 5.0 - 8.0   Glucose, UA NEGATIVE NEGATIVE mg/dL   Hgb urine dipstick SMALL (A) NEGATIVE   Bilirubin Urine NEGATIVE NEGATIVE   Ketones, ur NEGATIVE NEGATIVE mg/dL   Protein, ur NEGATIVE NEGATIVE mg/dL   Nitrite NEGATIVE NEGATIVE   Leukocytes, UA NEGATIVE NEGATIVE   RBC / HPF 0-5 0 - 5 RBC/hpf   WBC, UA NONE SEEN 0 - 5 WBC/hpf   Bacteria, UA NONE SEEN NONE SEEN   Squamous Epithelial / LPF 0-5 0 - 5   Mucus PRESENT      Comment: Performed at [facility], [address] [place], [place] [address]   No results found.  Pending Labs Unresulted Labs (From admission, onward)    Start     Ordered   02/14/18 0500  Lipid panel  Tomorrow morning,   STAT     02/13/18 1518   Signed and Held  HIV antibody (Routine Testing)  Once,   R     Signed and Held   Signed and Held  CBC  (enoxaparin (LOVENOX)    CrCl >/= 30 ml/min)  Once,   R    Comments:  Baseline for enoxaparin therapy IF NOT ALREADY DRAWN.  Notify MD if PLT < 100 K.    Signed and Held   Signed and Held  Creatinine, serum  (enoxaparin (LOVENOX)    CrCl >/= 30 ml/min)  Once,   R    Comments:  Baseline for enoxaparin therapy IF NOT ALREADY DRAWN.    Signed and Held   Signed and Held  Creatinine, serum  (enoxaparin (LOVENOX)    CrCl >/= 30 ml/min)  Weekly,   R    Comments:  while on enoxaparin therapy    Signed and Held   Signed and Held  Basic metabolic panel  Tomorrow morning,   R     Signed and Held          Vitals/Pain Today's Vitals   02/13/18 1208 02/13/18 1412  BP: (!) 153/89 (!) 182/88  Pulse: 72 69  Resp: 18 18  Temp: 98.2 F (36.8 C)   TempSrc: Oral   SpO2: 98% 100%  Weight: 83.5 kg   Height: _0  (1.93 m)   PainSc: 10-Worst pain ever 10-Worst pain ever    Isolation Precautions No active isolations  Medications Medications  morphine 2 MG/ML injection 2 mg (has no administration in time range)  morphine 4 MG/ML injection 4 mg (4 mg Intravenous Given 02/13/18 1502)  ondansetron (ZOFRAN) injection 4 mg (4 mg Intravenous Given 02/13/18 1501)  0.9 %  sodium chloride infusion (1,000 mLs Intravenous New Bag/Given 02/13/18 1457)    Mobility walks

## 2018-02-13 NOTE — Progress Notes (Signed)
Family Meeting Note  Advance Directive:yes  Today a meeting took place with the Patient.  Patient is able to participate   The following clinical team members were present during this meeting:MD  The following were discussed:Patient's diagnosis: Acute recurrent pancreatitis, Patient's progosis: Unable to determine and Goals for treatment: Full Code  Additional follow-up to be provided: prn  Time spent during discussion:20 minutes  Gorden Harms, MD

## 2018-02-13 NOTE — H&P (Signed)
[place] [facility] [place] NAME: Dylan Long    MR#:  [id number]  DATE OF BIRTH:  [birth year]  DATE OF ADMISSION:  02/13/2018  PRIMARY CARE PHYSICIAN: Crist Infante, MD   REQUESTING/REFERRING PHYSICIAN:   CHIEF COMPLAINT:   Chief Complaint  Patient presents with  . Abdominal Pain    HISTORY OF PRESENT ILLNESS: Dylan Long  is a 67 y.o. male with a known history per below which included acute pancreatitis secondary to alcohol abuse, presenting to the emergency room with recurrent epigastric pain similar to previous episode of pancreatitis, in the emergency room patient was found to have lipase of 486, AST with 102, ALT 161, blood pressure 182/88, patient evaluated in the emergency room, no apparent distress, resting comfortably in bed, wife at the bedside, patient is now been admitted for acute recurrent pancreatitis.  PAST MEDICAL HISTORY:   Past Medical History:  Diagnosis Date  . Aortic stenosis   . Aortic valve sclerosis    with functionally bicuspid aortic valve  . Dyslipidemia   . Pancreatitis     PAST SURGICAL HISTORY:  Past Surgical History:  Procedure Laterality Date  . LUMBAR FUSION      SOCIAL HISTORY:  Social History   Tobacco Use  . Smoking status: Former Smoker    Types: Cigarettes    Last attempt to quit: 09/26/1999    Years since quitting: 18.3  . Smokeless tobacco: Never Used  Substance Use Topics  . Alcohol use: Yes    Comment: 3-4 beers (12 oz) a day    FAMILY HISTORY:  Family History  Problem Relation Age of Onset  . Stroke Mother   . Heart disease Father   . Heart attack Father   . Hyperlipidemia Brother        Dyslipidemia    DRUG ALLERGIES: No Known Allergies  REVIEW OF SYSTEMS:   CONSTITUTIONAL: No fever, fatigue or weakness.  EYES: No blurred or double vision.  EARS, NOSE, AND THROAT: No tinnitus or ear pain.  RESPIRATORY: No cough, shortness of breath, wheezing or hemoptysis.  CARDIOVASCULAR:  No chest pain, orthopnea, edema.  GASTROINTESTINAL: No nausea, vomiting, diarrhea + abdominal pain.  GENITOURINARY: No dysuria, hematuria.  ENDOCRINE: No polyuria, nocturia,  HEMATOLOGY: No anemia, easy bruising or bleeding SKIN: No rash or lesion. MUSCULOSKELETAL: No joint pain or arthritis.   NEUROLOGIC: No tingling, numbness, weakness.  PSYCHIATRY: No anxiety or depression.   MEDICATIONS AT HOME:  Prior to Admission medications   Medication Sig Start Date End Date Taking? Authorizing Provider  cyclobenzaprine (FLEXERIL) 10 MG tablet Take 10 mg by mouth 3 (three) times daily as needed for muscle spasms.    Yes [provider]  HYDROcodone-acetaminophen (NORCO/VICODIN) 5-325 MG tablet Take 1-2 tablets by mouth every 6 (six) hours as needed for pain. 06/27/16  Yes [provider]  pantoprazole (PROTONIX) 40 MG tablet Take 40 mg by mouth daily.   Yes [provider]  tadalafil (ADCIRCA/CIALIS) 20 MG tablet Take 1 tablet (20 mg total) by mouth daily as needed for erectile dysfunction. 02/11/18  Yes Billey Co, MD  temazepam (RESTORIL) 30 MG capsule Take 30 mg by mouth at bedtime as needed for sleep. 08/15/16  Yes [provider]      PHYSICAL EXAMINATION:   VITAL SIGNS: Blood pressure (!) 182/88, pulse 69, temperature 98.2 F (36.8 C), temperature source Oral, resp. rate 18, height 6\' 4"  (1.93 m), weight 83.5 kg, SpO2 100 %.  GENERAL:  67 y.o.-year-old patient lying in the bed with no acute distress.  EYES: Pupils equal, round, reactive to light and accommodation. No scleral icterus. Extraocular muscles intact.  HEENT: Head atraumatic, normocephalic. Oropharynx and nasopharynx clear.  NECK:  Supple, no jugular venous distention. No thyroid enlargement, no tenderness.  LUNGS: Normal breath sounds bilaterally, no wheezing, rales,rhonchi or crepitation. No use of accessory muscles of respiration.  CARDIOVASCULAR: S1, S2 normal. No murmurs, rubs, or  gallops.  ABDOMEN: Soft, nontender, nondistended. Bowel sounds present. No organomegaly or mass.  EXTREMITIES: No pedal edema, cyanosis, or clubbing.  NEUROLOGIC: Cranial nerves II through XII are intact. Muscle strength 5/5 in all extremities. Sensation intact. Gait not checked.  PSYCHIATRIC: The patient is alert and oriented x 3.  SKIN: No obvious rash, lesion, or ulcer.   LABORATORY PANEL:   CBC Recent Labs  Lab 02/13/18 1214  WBC 8.7  HGB 16.7  HCT 49.1  PLT 182  MCV 96.1  MCH 32.7  MCHC 34.0  RDW 12.3   ------------------------------------------------------------------------------------------------------------------  Chemistries  Recent Labs  Lab 02/13/18 1214  NA 134*  K 5.1  CL 98  CO2 28  GLUCOSE 127*  BUN 9  CREATININE 1.17  CALCIUM 9.6  AST 102*  ALT 161*  ALKPHOS 102  BILITOT 1.0   ------------------------------------------------------------------------------------------------------------------ estimated creatinine clearance is 72.4 mL/min (by C-G formula based on SCr of 1.17 mg/dL). ------------------------------------------------------------------------------------------------------------------ No results for input(s): TSH, T4TOTAL, T3FREE, THYROIDAB in the last 72 hours.  Invalid input(s): FREET3   Coagulation profile No results for input(s): INR, PROTIME in the last 168 hours. ------------------------------------------------------------------------------------------------------------------- No results for input(s): DDIMER in the last 72 hours. -------------------------------------------------------------------------------------------------------------------  Cardiac Enzymes No results for input(s): CKMB, TROPONINI, MYOGLOBIN in the last 168 hours.  Invalid input(s): CK ------------------------------------------------------------------------------------------------------------------ Invalid input(s):  POCBNP  ---------------------------------------------------------------------------------------------------------------  Urinalysis    Component Value Date/Time   COLORURINE YELLOW (A) 02/13/2018 1214   APPEARANCEUR CLEAR (A) 02/13/2018 1214   LABSPEC 1.012 02/13/2018 1214   PHURINE 6.0 02/13/2018 1214   GLUCOSEU NEGATIVE 02/13/2018 1214   HGBUR SMALL (A) 02/13/2018 1214   BILIRUBINUR NEGATIVE 02/13/2018 1214   KETONESUR NEGATIVE 02/13/2018 1214   PROTEINUR NEGATIVE 02/13/2018 1214   NITRITE NEGATIVE 02/13/2018 1214   LEUKOCYTESUR NEGATIVE 02/13/2018 1214     RADIOLOGY: No results found.  EKG: Orders placed or performed during the hospital encounter of 08/23/16  . EKG 12-Lead  . EKG 12-Lead  . ED EKG within 10 minutes  . ED EKG within 10 minutes  . EKG    IMPRESSION AND PLAN: *Acute recurrent pancreatitis *Acute hypertension *History of alcohol abuse *Chronic hyperlipidemia unspecified  Admit to medical floor, check liver ultrasound for further evaluation, lipids in the morning, alcohol withdrawal protocol while in house, IV fluids for rehydration, adult pain protocol, start beta-blocker therapy, and continue close medical monitoring   All the records are reviewed and case discussed with ED provider. Management plans discussed with the patient, family and they are in agreement.  CODE STATUS:full Code Status History    Date Active Date Inactive Code Status Order ID Comments User Context   08/23/2016 1657 08/24/2016 1450 Full Code [id number]  Bettey Costa, MD Inpatient       TOTAL TIME TAKING CARE OF THIS PATIENT: 40 minutes.    Avel Peace Salary M.D on 02/13/2018   Between 7am to 6pm - Pager - [phone]  After 6pm go to [email or web address] - Proofreader  [employer]  [phone]  CC: Primary care physician; Crist Infante, MD   Note: This dictation was prepared with Dragon dictation along with smaller phrase technology. Any  transcriptional errors that result from this process are unintentional.

## 2018-02-14 ENCOUNTER — Inpatient Hospital Stay: Payer: PPO

## 2018-02-14 LAB — LIPID PANEL
Cholesterol: 206 mg/dL — ABNORMAL HIGH (ref 0–200)
HDL: 66 mg/dL (ref >40)
LDL Cholesterol: 122 mg/dL — ABNORMAL HIGH (ref 0–99)
Total CHOL/HDL Ratio: 3.1 RATIO
Triglycerides: 89 mg/dL (ref <150)
VLDL: 18 mg/dL (ref 0–40)

## 2018-02-14 LAB — BASIC METABOLIC PANEL
Anion gap: 8 (ref 5–15)
BUN: 8 mg/dL (ref 8–23)
CO2: 27 mmol/L (ref 22–32)
Calcium: 8.7 mg/dL — ABNORMAL LOW (ref 8.9–10.3)
Chloride: 99 mmol/L (ref 98–111)
Creatinine, Ser: 1.01 mg/dL (ref 0.61–1.24)
GFR calc Af Amer: >60 mL/min (ref >60)
GFR calc non Af Amer: >60 mL/min (ref >60)
Glucose, Bld: 104 mg/dL — ABNORMAL HIGH (ref 70–99)
Potassium: 4.1 mmol/L (ref 3.5–5.1)
Sodium: 134 mmol/L — ABNORMAL LOW (ref 135–145)

## 2018-02-14 LAB — HEPATIC FUNCTION PANEL
ALT: 128 U/L — ABNORMAL HIGH (ref 0–44)
AST: 72 U/L — ABNORMAL HIGH (ref 15–41)
Albumin: 3.6 g/dL (ref 3.5–5.0)
Alkaline Phosphatase: 100 U/L (ref 38–126)
Bilirubin, Direct: 0.2 mg/dL (ref 0.0–0.2)
Indirect Bilirubin: 0.7 mg/dL (ref 0.3–0.9)
Total Bilirubin: 0.9 mg/dL (ref 0.3–1.2)
Total Protein: 7.2 g/dL (ref 6.5–8.1)

## 2018-02-14 LAB — LIPASE, BLOOD: Lipase: 117 U/L — ABNORMAL HIGH (ref 11–51)

## 2018-02-14 MED ORDER — GADOBUTROL 1 MMOL/ML IV SOLN
7.0000 mL | Freq: Once | INTRAVENOUS | Status: AC | PRN
  Administered 2018-02-14: 7 mL via INTRAVENOUS

## 2018-02-14 NOTE — Progress Notes (Signed)
[place] [facility]                                                                                                                                                                                  Patient Demographics   Dylan Long, is a 67 y.o. male, DOB - 1951-01-23, FIE:[id number]  Admit date - 02/13/2018   Admitting Physician Glenice Bow, DO  Outpatient Primary MD for the patient is Crist Infante, MD   LOS - 1  Subjective: Pt admitted with abdominal now improved Patient reports that he only drinks 1 beer a day Review of Systems:   CONSTITUTIONAL: No documented fever. No fatigue, weakness. No weight gain, no weight loss.  EYES: No blurry or double vision.  ENT: No tinnitus. No postnasal drip. No redness of the oropharynx.  RESPIRATORY: No cough, no wheeze, no hemoptysis. No dyspnea.  CARDIOVASCULAR: No chest pain. No orthopnea. No palpitations. No syncope.  GASTROINTESTINAL: No nausea, no vomiting or diarrhea. No abdominal pain. No melena or hematochezia.  GENITOURINARY: No dysuria or hematuria.  ENDOCRINE: No polyuria or nocturia. No heat or cold intolerance.  HEMATOLOGY: No anemia. No bruising. No bleeding.  INTEGUMENTARY: No rashes. No lesions.  MUSCULOSKELETAL: No arthritis. No swelling. No gout.  NEUROLOGIC: No numbness, tingling, or ataxia. No seizure-type activity.  PSYCHIATRIC: No anxiety. No insomnia. No ADD.    Vitals:   Vitals:   02/14/18 0021 02/14/18 0439 02/14/18 0441 02/14/18 0444  BP: (!) 147/84 133/84 139/80 (!) 135/58  Pulse: 62 72 70 82  Resp:  20  20  Temp:  99.1 F (37.3 C)  98.2 F (36.8 C)  TempSrc:  Oral  Oral  SpO2:  95%  95%  Weight:      Height:        Wt Readings from Last 3 Encounters:  02/13/18 81.9 kg  02/11/18 83.8 kg  08/23/16 78 kg     Intake/Output Summary (Last 24 hours) at 02/14/2018 1447 Last data filed at 02/14/2018 0300 Gross per 24 hour  Intake 1231.51 ml  Output -  Net 1231.51 ml     Physical Exam:   GENERAL: Pleasant-appearing in no apparent distress.  HEAD, EYES, EARS, NOSE AND THROAT: Atraumatic, normocephalic. Extraocular muscles are intact. Pupils equal and reactive to light. Sclerae anicteric. No conjunctival injection. No oro-pharyngeal erythema.  NECK: Supple. There is no jugular venous distention. No bruits, no lymphadenopathy, no thyromegaly.  HEART: Regular rate and rhythm,. No murmurs, no rubs, no clicks.  LUNGS: Clear to auscultation bilaterally. No rales or rhonchi. No wheezes.  ABDOMEN: Soft, flat, nontender, nondistended. Has good bowel sounds. No hepatosplenomegaly appreciated.  EXTREMITIES: No  evidence of any cyanosis, clubbing, or peripheral edema.  +2 pedal and radial pulses bilaterally.  NEUROLOGIC: The patient is alert, awake, and oriented x3 with no focal motor or sensory deficits appreciated bilaterally.  SKIN: Moist and warm with no rashes appreciated.  Psych: Not anxious, depressed LN: No inguinal LN enlargement    Antibiotics   Anti-infectives (From admission, onward)   None      Medications   Scheduled Meds: . docusate sodium  100 mg Oral BID  . enoxaparin (LOVENOX) injection  40 mg Subcutaneous Q24H  . folic acid  1 mg Oral Daily  . Influenza vac split quadrivalent PF  0.5 mL Intramuscular Tomorrow-1000  . multivitamin with minerals  1 tablet Oral Daily  . pantoprazole  40 mg Oral Daily  . thiamine  100 mg Oral Daily   Or  . thiamine  100 mg Intravenous Daily   Continuous Infusions: . sodium chloride 100 mL/hr at 02/14/18 0139   PRN Meds:.cyclobenzaprine, HYDROcodone-acetaminophen, LORazepam **OR** LORazepam, metoprolol tartrate, morphine injection, ondansetron **OR** ondansetron (ZOFRAN) IV, polyethylene glycol, temazepam, traZODone   Data Review:   Micro Results No results found for this or any previous visit (from the past 240 hour(s)).  Radiology Reports US Abdomen Complete  Result Date: 02/14/2018 CLINICAL  DATA:  Pancreatitis. EXAM: ABDOMEN ULTRASOUND COMPLETE COMPARISON:  None. FINDINGS: Gallbladder: No evidence of cholelithiasis or sludge. Gallbladder is mildly contracted although demonstrates moderate wall thickening measuring 7.6 mm. Negative sonographic Murphy sign and no adjacent free fluid. Common bile duct: Diameter: Dilated measuring 1.5 cm. No definite choledocholithiasis visualized. Liver: No focal lesion identified. Within normal limits in parenchymal echogenicity. Portal vein is patent on color Doppler imaging with normal direction of blood flow towards the liver. IVC: No abnormality visualized. Pancreas: Visualized portion unremarkable. Spleen: Size and appearance within normal limits. Right Kidney: Length: 11.1 cm. Echogenicity within normal limits. No mass or hydronephrosis visualized. Left Kidney: Length: 11.4 cm. Echogenicity within normal limits. No mass or hydronephrosis visualized. Abdominal aorta: No aneurysm visualized. Other findings: None. IMPRESSION: Gallbladder mildly contracted without evidence of gallstones/sludge. There is nonspecific wall thickening measuring 7.6 mm. Dilated common bile duct measuring 1.5 cm without evidence of choledocholithiasis. Consider MRCP for further evaluation. Electronically Signed   By: Marin Olp M.D.   On: 02/14/2018 09:21     CBC Recent Labs  Lab 02/13/18 1214  WBC 8.7  HGB 16.7  HCT 49.1  PLT 182  MCV 96.1  MCH 32.7  MCHC 34.0  RDW 12.3    Chemistries  Recent Labs  Lab 02/13/18 1214 02/14/18 0454  NA 134* 134*  K 5.1 4.1  CL 98 99  CO2 28 27  GLUCOSE 127* 104*  BUN 9 8  CREATININE 1.17 1.01  CALCIUM 9.6 8.7*  AST 102* 72*  ALT 161* 128*  ALKPHOS 102 100  BILITOT 1.0 0.9   ------------------------------------------------------------------------------------------------------------------ estimated creatinine clearance is 82.2 mL/min (by C-G formula based on SCr of 1.01  mg/dL). ------------------------------------------------------------------------------------------------------------------ No results for input(s): HGBA1C in the last 72 hours. ------------------------------------------------------------------------------------------------------------------ Recent Labs    02/14/18 0454  CHOL 206*  HDL 66  LDLCALC 122*  TRIG 89  CHOLHDL 3.1   ------------------------------------------------------------------------------------------------------------------ No results for input(s): TSH, T4TOTAL, T3FREE, THYROIDAB in the last 72 hours.  Invalid input(s): FREET3 ------------------------------------------------------------------------------------------------------------------ No results for input(s): VITAMINB12, FOLATE, FERRITIN, TIBC, IRON, RETICCTPCT in the last 72 hours.  Coagulation profile No results for input(s): INR, PROTIME in the last 168 hours.  No results for input(s): DDIMER  in the last 72 hours.  Cardiac Enzymes No results for input(s): CKMB, TROPONINI, MYOGLOBIN in the last 168 hours.  Invalid input(s): CK ------------------------------------------------------------------------------------------------------------------ Invalid input(s): [place]  Patient 67 year old admitted with abdominal pain and acute pancreatitis  *Acute recurrent pancreatitis: Improved recheck lipase today, patient's ultrasound shows dilated common bile duct I will obtain an MRCP *Acute hypertension use PRN IV metoprolol *History of alcohol abuse patient denies that she only drinks 1 beer a day *Chronic hyperlipidemia unspecified     Code Status Orders  (From admission, onward)         Start     Ordered   02/13/18 1632  Full code  Continuous     02/13/18 1631        Code Status History    Date Active Date Inactive Code Status Order ID Comments User Context   08/23/2016 1657 08/24/2016 1450 Full Code [id number]  Bettey Costa, MD  Inpatient           Consults none  DVT Prophylaxis  Lovenox  Lab Results  Component Value Date   PLT 182 02/13/2018     Time Spent in minutes 35 minutes greater than 50% of time spent in care coordination and counseling patient regarding the condition and plan of care.   Dustin Flock M.D on 02/14/2018 at 2:47 PM  Between 7am to 6pm - Pager - [phone]  After 6pm go to [email or web address] - Proofreader  [employer]   Office  [phone]

## 2018-02-14 NOTE — Consult Note (Signed)
SURGICAL CONSULTATION NOTE   HISTORY OF PRESENT ILLNESS (HPI):  67 y.o. male presented to [facility] ED for evaluation of abdominal pain. Patient reports started with abdominal pain on Thursday after eating a spicy meal. That night he felt epigastric pain. Pain did not radiated to other part of the body. Pain very severe. Pain exacerbated by food. Denies alleviator factor.  Denies fever or chills. Refers the first episodes was a year ago and was with same spicy food. He refers he drinks 1-3 beers but not even every day. The day of the pain he drank around 4 beers. Denies nausea and vomiting.  Patient went to ED and found with lipase of 486. Normal WBC count, electrolytes and mild elevation of liver enzymes liver enzymes with normal bilirubin and Alk phos. An ultrasound was done and found gallbladder wall thickening and dilated common bile duct. MRCP shows again gallbladder wall thickening with minimal CBD dilation and no choledocholithiasis. Neither stones shows stones or sludge.   Surgery is consulted by Dr. Posey Pronto in this context for evaluation and management of acalculous cholecystitis.  PAST MEDICAL HISTORY (PMH):  Past Medical History:  Diagnosis Date  . Aortic stenosis   . Aortic valve sclerosis    with functionally bicuspid aortic valve  . Dyslipidemia   . Pancreatitis      PAST SURGICAL HISTORY [facility]):  Past Surgical History:  Procedure Laterality Date  . LUMBAR FUSION       MEDICATIONS:  Prior to Admission medications   Medication Sig Start Date End Date Taking? Authorizing Provider  cyclobenzaprine (FLEXERIL) 10 MG tablet Take 10 mg by mouth 3 (three) times daily as needed for muscle spasms.    Yes [provider]  HYDROcodone-acetaminophen (NORCO/VICODIN) 5-325 MG tablet Take 1-2 tablets by mouth every 6 (six) hours as needed for pain. 06/27/16  Yes [provider]  pantoprazole (PROTONIX) 40 MG tablet Take 40 mg by mouth daily.   Yes [provider]   tadalafil (ADCIRCA/CIALIS) 20 MG tablet Take 1 tablet (20 mg total) by mouth daily as needed for erectile dysfunction. 02/11/18  Yes Billey Co, MD  temazepam (RESTORIL) 30 MG capsule Take 30 mg by mouth at bedtime as needed for sleep. 08/15/16  Yes [provider]     ALLERGIES:  No Known Allergies   SOCIAL HISTORY:  Social History   Socioeconomic History  . Marital status: Married    Spouse name: Not on file  . Number of children: 2  . Years of education: Not on file  . Highest education level: Not on file  Occupational History  . Occupation: Barista  . Financial resource strain: Not on file  . Food insecurity:    Worry: Not on file    Inability: Not on file  . Transportation needs:    Medical: Not on file    Non-medical: Not on file  Tobacco Use  . Smoking status: Former Smoker    Types: Cigarettes    Last attempt to quit: 09/26/1999    Years since quitting: 18.4  . Smokeless tobacco: Never Used  Substance and Sexual Activity  . Alcohol use: Yes    Comment: 3-4 beers (12 oz) a day  . Drug use: Yes    Types: Marijuana  . Sexual activity: Yes  Lifestyle  . Physical activity:    Days per week: Not on file    Minutes per session: Not on file  . Stress: Not on file  Relationships  .  Social connections:    Talks on phone: Not on file    Gets together: Not on file    Attends religious service: Not on file    Active member of club or organization: Not on file    Attends meetings of clubs or organizations: Not on file    Relationship status: Not on file  . Intimate partner violence:    Fear of current or ex partner: Not on file    Emotionally abused: Not on file    Physically abused: Not on file    Forced sexual activity: Not on file  Other Topics Concern  . Not on file  Social History Narrative  . Not on file    The patient currently resides (home / rehab facility / nursing home): Home The patient normally is (ambulatory /  bedbound): Ambulatory   FAMILY HISTORY:  Family History  Problem Relation Age of Onset  . Stroke Mother   . Heart disease Father   . Heart attack Father   . Hyperlipidemia Brother        Dyslipidemia     REVIEW OF SYSTEMS:  Constitutional: denies weight loss, fever, chills, or sweats  Eyes: denies any other vision changes, history of eye injury  ENT: denies sore throat, hearing problems  Respiratory: denies shortness of breath, wheezing  Cardiovascular: denies chest pain, palpitations  Gastrointestinal: abdominal pain, Denies N/V,  Genitourinary: denies burning with urination or urinary frequency Musculoskeletal: denies any other joint pains or cramps  Skin: denies any other rashes or skin discolorations  Neurological: denies any other headache, dizziness, weakness  Psychiatric: denies any other depression, anxiety   All other review of systems were negative   VITAL SIGNS:  Temp:  [98.2 F (36.8 C)-99.2 F (37.3 C)] 98.8 F (37.1 C) (10/19 1617) Pulse Rate:  [59-82] 59 (10/19 1617) Resp:  [20-22] 20 (10/19 0444) BP: (133-160)/(58-84) 145/84 (10/19 1617) SpO2:  [95 %-99 %] 96 % (10/19 1617)     Height: '6\' 4"'  (193 cm) Weight: 81.9 kg BMI (Calculated): 21.99   INTAKE/OUTPUT:  This shift: No intake/output data recorded.  Last 2 shifts: '@[email or web address]' @   PHYSICAL EXAM:  Constitutional:  -- Normal body habitus  -- Awake, alert, and oriented x3  Eyes:  -- Pupils equally round and reactive to light  -- No scleral icterus  Ear, nose, and throat:  -- No jugular venous distension  Pulmonary:  -- No crackles  -- Equal breath sounds bilaterally -- Breathing non-labored at rest Cardiovascular:  -- S1, S2 present  -- No pericardial rubs Gastrointestinal:  -- Abdomen soft, mild tender on epigastric area, non-distended, no guarding or rebound tenderness -- No abdominal masses appreciated, pulsatile or otherwise  Musculoskeletal and Integumentary:  -- Wounds or skin  discoloration: None appreciated -- Extremities: B/L UE and LE FROM, hands and feet warm, no edema  Neurologic:  -- Motor function: intact and symmetric -- Sensation: intact and symmetric   Labs:  CBC Latest Ref Rng & Units 02/13/2018 08/24/2016 08/23/2016  WBC 4.0 - 10.5 K/uL 8.7 9.8 16.8(H)  Hemoglobin 13.0 - 17.0 g/dL 16.7 13.9 16.8  Hematocrit 39.0 - 52.0 % 49.1 39.6(L) 49.6  Platelets 150 - 400 K/uL 182 172 240   CMP Latest Ref Rng & Units 02/14/2018 02/13/2018 08/24/2016  Glucose 70 - 99 mg/dL 104(H) 127(H) 105(H)  BUN 8 - 23 mg/dL '8 9 12  ' Creatinine 0.61 - 1.24 mg/dL 1.01 1.17 0.92  Sodium 135 - 145 mmol/L 134(L) 134(L) 134(L)  Potassium 3.5 - 5.1 mmol/L 4.1 5.1 3.8  Chloride 98 - 111 mmol/L 99 98 105  CO2 22 - 32 mmol/L '27 28 26  ' Calcium 8.9 - 10.3 mg/dL 8.7(L) 9.6 8.3(L)  Total Protein 6.5 - 8.1 g/dL 7.2 8.1 -  Total Bilirubin 0.3 - 1.2 mg/dL 0.9 1.0 -  Alkaline Phos 38 - 126 U/L 100 102 -  AST 15 - 41 U/L 72(H) 102(H) -  ALT 0 - 44 U/L 128(H) 161(H) -   Triglycerides: 89  Imaging studies:  EXAM: ABDOMEN ULTRASOUND COMPLETE  COMPARISON:  None.  FINDINGS: Gallbladder: No evidence of cholelithiasis or sludge. Gallbladder is mildly contracted although demonstrates moderate wall thickening measuring 7.6 mm. Negative sonographic Murphy sign and no adjacent free fluid.  Common bile duct: Diameter: Dilated measuring 1.5 cm. No definite choledocholithiasis visualized.  Liver: No focal lesion identified. Within normal limits in parenchymal echogenicity. Portal vein is patent on color Doppler imaging with normal direction of blood flow towards the liver.  IVC: No abnormality visualized.  Pancreas: Visualized portion unremarkable.  Spleen: Size and appearance within normal limits.  Right Kidney: Length: 11.1 cm. Echogenicity within normal limits. No mass or hydronephrosis visualized.  Left Kidney: Length: 11.4 cm. Echogenicity within normal limits.  No mass or hydronephrosis visualized.  Abdominal aorta: No aneurysm visualized.  Other findings: None.  IMPRESSION: Gallbladder mildly contracted without evidence of gallstones/sludge. There is nonspecific wall thickening measuring 7.6 mm.  Dilated common bile duct measuring 1.5 cm without evidence of choledocholithiasis. Consider MRCP for further evaluation.   Electronically Signed   By: Marin Olp M.D.   On: 02/14/2018 09:21  EXAM: MRI ABDOMEN WITHOUT AND WITH CONTRAST (INCLUDING MRCP)  TECHNIQUE: Multiplanar multisequence MR imaging of the abdomen was performed both before and after the administration of intravenous contrast. Heavily T2-weighted images of the biliary and pancreatic ducts were obtained, and three-dimensional MRCP images were rendered by post processing.  CONTRAST:  7 cc Gadavist.  COMPARISON:  None.  FINDINGS: Lower chest: No acute findings.  Hepatobiliary: No mass or other parenchymal abnormality identified. There is mild diffuse gallbladder wall edema with mucosal enhancement. Wall thickness measures up to 1.3 cm. No gallstones identified.  The common bile duct measures up to 11 mm proximally and tapers to 5 mm distally. Mild intrahepatic biliary dilatation. No choledocholithiasis or mass identified.  Pancreas: No significant it scratch set no significant inflammation. No main duct dilatation or mass. No complications of pancreatitis identified. Specifically there is no pseudocyst or necrosis.  Spleen:  Within normal limits in size and appearance.  Adrenals/Urinary Tract: No masses identified. No evidence of hydronephrosis.  Stomach/Bowel: Stomach appears normal. There are 2 large diverticula associated with the descending duodenum and proximal portion of the transverse duodenum, image 35/23 and image 41/23. The remaining visualized small and large bowel loops are otherwise unremarkable.  Vascular/Lymphatic: Normal  appearance of the abdominal aorta. 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 No adenopathy.  Other:  No free fluid or fluid collections.  Musculoskeletal: No suspicious bone lesions identified.  IMPRESSION: 1. There is diffuse gallbladder wall thickening and mucosal enhancement. No stones identified. Findings may represent acute acalculous cholecystitis. Clinical correlation advised. 2. Mild dilatation of the common bile duct without evidence for obstructing mass or choledocholithiasis. 3. No significant pancreatic inflammation identified. No complications of pancreatitis noted. 4. Duodenal diverticula identified.  Electronically Signed   By: Kerby Moors M.D.   On: 02/14/2018 15:10  Assessment/Plan:  67 y.o. male with pancreatitis of unknown etiology with gallbladder wall thickening suspecting acalculous cholecystitis.  Patient abdominal pain on epigastric area and does not radiates to the right upper quadrant or back. A relative healthy male patient with acalculous cholecystitis is very uncommon. Due to ultrasound and MRI finding of suspicious cholecystitis, will order a HIDA scan to confirm acute cholecystitis. I oriented the patient about the imaging findings and told him that is very rare to have cholecystitis without stones or sludge, but will confirm it with the HIDA. If the HIDA is positive, will discuss with patient about cholecystectomy. If the HIDA is negative cholecystitis is very unlikely and conservative management should continue. Patient at this moment with mild pain and tolerated clear liquids. Patient also oriented that, even if HIDA is positive and cholecystectomy is done, it cannot be guaranteed that this will resolve or prevent pancreatitis in the future because if he does not has stones or sludge, gallstone pancreatitis should not be the cause of pancreatitis. At this moment the closest cause would be the spicy food with  alcohol intake the day of the pain. But like patient discussed with primary care, sometimes the cause of the pancreatitis cannot be determined.   Arnold Long, MD

## 2018-02-15 ENCOUNTER — Inpatient Hospital Stay: Payer: PPO

## 2018-02-15 ENCOUNTER — Encounter: Payer: Self-pay | Admitting: Radiology

## 2018-02-15 LAB — LIPASE, BLOOD: Lipase: 65 U/L — ABNORMAL HIGH (ref 11–51)

## 2018-02-15 LAB — COMPREHENSIVE METABOLIC PANEL
ALT: 77 U/L — ABNORMAL HIGH (ref 0–44)
AST: 28 U/L (ref 15–41)
Albumin: 3.4 g/dL — ABNORMAL LOW (ref 3.5–5.0)
Alkaline Phosphatase: 76 U/L (ref 38–126)
Anion gap: 6 (ref 5–15)
BUN: 10 mg/dL (ref 8–23)
CO2: 28 mmol/L (ref 22–32)
Calcium: 8.3 mg/dL — ABNORMAL LOW (ref 8.9–10.3)
Chloride: 103 mmol/L (ref 98–111)
Creatinine, Ser: 1.09 mg/dL (ref 0.61–1.24)
GFR calc Af Amer: >60 mL/min (ref >60)
GFR calc non Af Amer: >60 mL/min (ref >60)
Glucose, Bld: 100 mg/dL — ABNORMAL HIGH (ref 70–99)
Potassium: 3.8 mmol/L (ref 3.5–5.1)
Sodium: 137 mmol/L (ref 135–145)
Total Bilirubin: 0.7 mg/dL (ref 0.3–1.2)
Total Protein: 6.6 g/dL (ref 6.5–8.1)

## 2018-02-15 MED ORDER — AMOXICILLIN-POT CLAVULANATE 875-125 MG PO TABS: 1.0000 | ORAL_TABLET | Freq: Two times a day (BID) | ORAL | 0 refills | Status: AC

## 2018-02-15 MED ORDER — TECHNETIUM TC 99M MEBROFENIN IV KIT
5.1900 | PACK | Freq: Once | INTRAVENOUS | Status: AC | PRN
  Administered 2018-02-15: 5.19 via INTRAVENOUS

## 2018-02-15 MED ORDER — TECHNETIUM TC 99M MEBROFENIN IV KIT
1.4800 | PACK | Freq: Once | INTRAVENOUS | Status: AC | PRN
  Administered 2018-02-15: 12:00:00 1.48 via INTRAVENOUS

## 2018-02-15 NOTE — Discharge Summary (Signed)
[facility] - [place] at [facility], 67 y.o., DOB [birth year], MRN [id number]. Admission date: 02/13/2018 Discharge Date 02/15/2018 Primary MD Crist Infante, MD Admitting Physician Glenice Bow, DO  Admission Diagnosis  Pancreatitis [K85.90] Acute pancreatitis, unspecified complication status, unspecified pancreatitis type [K85.90]  Discharge Diagnosis   Active Problems: Acute pancreatitis Elevated liver function test Elevated blood pressure noted during hospitalization Previous history of alcohol abuse patient states that he only drinks 1 beer a day Chronic hyperlipidemia Possible chronic cholecystitis  Hospital Course Patient 67 year old presented with abdominal pain.  He was noted to have acute pancreatitis based on his presentation.  Patient was kept n.p.o. and supportive care was provided.  He went to ultrasound of the abdomen which showed a dilated common bile duct therefore underwent a MRCP.  MRCP showed a possible gallbladder infection.  Therefore was seen by surgery who asked patient to have a HIDA scan.  HIDA scan shows possible chronic cholecystitis.  Patient at this point does not want to wait to see surgery today and is very anxious to go home.  I will place him on antibiotics and a follow-up with surgery in a week I strongly recommend patient follow-up with surgery to decide if any type of surgical intervention needs to be done.           Consults  general surgery  Significant Tests:  See full reports for all details     Nm Hepatobiliary Liver Func  Result Date: 02/15/2018 CLINICAL DATA:  Right upper quadrant pain EXAM: NUCLEAR MEDICINE HEPATOBILIARY IMAGING TECHNIQUE: Sequential images of the abdomen were obtained out to 60 minutes following intravenous administration of radiopharmaceutical. RADIOPHARMACEUTICALS:  5.19 mCi Tc-24m  Choletec IV COMPARISON:  Ultrasound 05/17/2017 FINDINGS: Prompt uptake and biliary excretion of activity  by the liver is seen. After 60 minutes of imaging no gallbladder activity visualized. The patient subsequently received 4 mg of morphine and a booster dose of Choletec. Additional imaging was carried out and gallbladder activity was eventually visualized, confirming patency of cystic duct. Biliary activity passes into small bowel, consistent with patent common bile duct. IMPRESSION: 1. Patent cystic duct without evidence for acute cholecystitis. 2. Delayed filling of the gallbladder which may be compatible with chronic cholecystitis. 3. Patent common bile duct. Electronically Signed   By: Kerby Moors M.D.   On: 02/15/2018 13:13   US Abdomen Complete  Result Date: 02/14/2018 CLINICAL DATA:  Pancreatitis. EXAM: ABDOMEN ULTRASOUND COMPLETE COMPARISON:  None. FINDINGS: Gallbladder: No evidence of cholelithiasis or sludge. Gallbladder is mildly contracted although demonstrates moderate wall thickening measuring 7.6 mm. Negative sonographic Murphy sign and no adjacent free fluid. Common bile duct: Diameter: Dilated measuring 1.5 cm. No definite choledocholithiasis visualized. Liver: No focal lesion identified. Within normal limits in parenchymal echogenicity. Portal vein is patent on color Doppler imaging with normal direction of blood flow towards the liver. IVC: No abnormality visualized. Pancreas: Visualized portion unremarkable. Spleen: Size and appearance within normal limits. Right Kidney: Length: 11.1 cm. Echogenicity within normal limits. No mass or hydronephrosis visualized. Left Kidney: Length: 11.4 cm. Echogenicity within normal limits. No mass or hydronephrosis visualized. Abdominal aorta: No aneurysm visualized. Other findings: None. IMPRESSION: Gallbladder mildly contracted without evidence of gallstones/sludge. There is nonspecific wall thickening measuring 7.6 mm. Dilated common bile duct measuring 1.5 cm without evidence of choledocholithiasis. Consider MRCP for further evaluation. Electronically  Signed   By: Marin Olp M.D.   On: 02/14/2018 09:21   Mr 3d Recon At Scanner  Result Date: 02/14/2018 CLINICAL DATA:  Abnormal liver function test. Epigastric pain. Pancreatitis. EXAM: MRI ABDOMEN WITHOUT AND WITH CONTRAST (INCLUDING MRCP) TECHNIQUE: Multiplanar multisequence MR imaging of the abdomen was performed both before and after the administration of intravenous contrast. Heavily T2-weighted images of the biliary and pancreatic ducts were obtained, and three-dimensional MRCP images were rendered by post processing. CONTRAST:  7 cc Gadavist. COMPARISON:  None. FINDINGS: Lower chest: No acute findings. Hepatobiliary: No mass or other parenchymal abnormality identified. There is mild diffuse gallbladder wall edema with mucosal enhancement. Wall thickness measures up to 1.3 cm. No gallstones identified. The common bile duct measures up to 11 mm proximally and tapers to 5 mm distally. Mild intrahepatic biliary dilatation. No choledocholithiasis or mass identified. Pancreas: No significant it scratch set no significant inflammation. No main duct dilatation or mass. No complications of pancreatitis identified. Specifically there is no pseudocyst or necrosis. Spleen:  Within normal limits in size and appearance. Adrenals/Urinary Tract: No masses identified. No evidence of hydronephrosis. Stomach/Bowel: Stomach appears normal. There are 2 large diverticula associated with the descending duodenum and proximal portion of the transverse duodenum, image 35/23 and image 41/23. The remaining visualized small and large bowel loops are otherwise unremarkable. Vascular/Lymphatic: Normal appearance of the abdominal aorta. 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 No adenopathy. Other:  No free fluid or fluid collections. Musculoskeletal: No suspicious bone lesions identified. IMPRESSION: 1. There is diffuse gallbladder wall thickening and mucosal enhancement. No stones  identified. Findings may represent acute acalculous cholecystitis. Clinical correlation advised. 2. Mild dilatation of the common bile duct without evidence for obstructing mass or choledocholithiasis. 3. No significant pancreatic inflammation identified. No complications of pancreatitis noted. 4. Duodenal diverticula identified. Electronically Signed   By: Kerby Moors M.D.   On: 02/14/2018 15:10   Mr Abdomen Mrcp W Wo Contast  Result Date: 02/14/2018 CLINICAL DATA:  Abnormal liver function test. Epigastric pain. Pancreatitis. EXAM: MRI ABDOMEN WITHOUT AND WITH CONTRAST (INCLUDING MRCP) TECHNIQUE: Multiplanar multisequence MR imaging of the abdomen was performed both before and after the administration of intravenous contrast. Heavily T2-weighted images of the biliary and pancreatic ducts were obtained, and three-dimensional MRCP images were rendered by post processing. CONTRAST:  7 cc Gadavist. COMPARISON:  None. FINDINGS: Lower chest: No acute findings. Hepatobiliary: No mass or other parenchymal abnormality identified. There is mild diffuse gallbladder wall edema with mucosal enhancement. Wall thickness measures up to 1.3 cm. No gallstones identified. The common bile duct measures up to 11 mm proximally and tapers to 5 mm distally. Mild intrahepatic biliary dilatation. No choledocholithiasis or mass identified. Pancreas: No significant it scratch set no significant inflammation. No main duct dilatation or mass. No complications of pancreatitis identified. Specifically there is no pseudocyst or necrosis. Spleen:  Within normal limits in size and appearance. Adrenals/Urinary Tract: No masses identified. No evidence of hydronephrosis. Stomach/Bowel: Stomach appears normal. There are 2 large diverticula associated with the descending duodenum and proximal portion of the transverse duodenum, image 35/23 and image 41/23. The remaining visualized small and large bowel loops are otherwise unremarkable.  Vascular/Lymphatic: Normal appearance of the abdominal aorta. 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 No adenopathy. Other:  No free fluid or fluid collections. Musculoskeletal: No suspicious bone lesions identified. IMPRESSION: 1. There is diffuse gallbladder wall thickening and mucosal enhancement. No stones identified. Findings may represent acute acalculous cholecystitis. Clinical correlation advised. 2.  Mild dilatation of the common bile duct without evidence for obstructing mass or choledocholithiasis. 3. No significant pancreatic inflammation identified. No complications of pancreatitis noted. 4. Duodenal diverticula identified. Electronically Signed   By: Kerby Moors M.D.   On: 02/14/2018 15:10       Today   Subjective:   Dylan Long patient's abdominal pain now is resolved Objective:   Blood pressure 128/80, pulse (!) 52, temperature 98.5 F (36.9 C), temperature source Oral, resp. rate 20, height 6\' 4"  (1.93 m), weight 81.9 kg, SpO2 98 %.  .  Intake/Output Summary (Last 24 hours) at 02/15/2018 1453 Last data filed at 02/15/2018 0604 Gross per 24 hour  Intake 2705.13 ml  Output -  Net 2705.13 ml    Exam VITAL SIGNS: Blood pressure 128/80, pulse (!) 52, temperature 98.5 F (36.9 C), temperature source Oral, resp. rate 20, height 6\' 4"  (1.93 m), weight 81.9 kg, SpO2 98 %.  GENERAL:  67 y.o.-year-old patient lying in the bed with no acute distress.  EYES: Pupils equal, round, reactive to light and accommodation. No scleral icterus. Extraocular muscles intact.  HEENT: Head atraumatic, normocephalic. Oropharynx and nasopharynx clear.  NECK:  Supple, no jugular venous distention. No thyroid enlargement, no tenderness.  LUNGS: Normal breath sounds bilaterally, no wheezing, rales,rhonchi or crepitation. No use of accessory muscles of respiration.  CARDIOVASCULAR: S1, S2 normal. No murmurs, rubs, or gallops.  ABDOMEN: Soft, nontender,  nondistended. Bowel sounds present. No organomegaly or mass.  EXTREMITIES: No pedal edema, cyanosis, or clubbing.  NEUROLOGIC: Cranial nerves II through XII are intact. Muscle strength 5/5 in all extremities. Sensation intact. Gait not checked.  PSYCHIATRIC: The patient is alert and oriented x 3.  SKIN: No obvious rash, lesion, or ulcer.   Data Review     CBC w Diff:  Lab Results  Component Value Date   WBC 8.7 02/13/2018   HGB 16.7 02/13/2018   HCT 49.1 02/13/2018   PLT 182 02/13/2018   CMP:  Lab Results  Component Value Date   NA 137 02/15/2018   K 3.8 02/15/2018   CL 103 02/15/2018   CO2 28 02/15/2018   BUN 10 02/15/2018   CREATININE 1.09 02/15/2018   PROT 6.6 02/15/2018   ALBUMIN 3.4 (L) 02/15/2018   BILITOT 0.7 02/15/2018   ALKPHOS 76 02/15/2018   AST 28 02/15/2018   ALT 77 (H) 02/15/2018  .  Micro Results No results found for this or any previous visit (from the past 240 hour(s)).      Code Status Orders  (From admission, onward)         Start     Ordered   02/13/18 1632  Full code  Continuous     02/13/18 1631        Code Status History    Date Active Date Inactive Code Status Order ID Comments User Context   08/23/2016 1657 08/24/2016 1450 Full Code [id number]  Bettey Costa, MD Inpatient          Follow-up Information    Herbert Pun, MD Follow up in 1 week(s).   Specialty:  General Surgery Why:  hosp f/u abnormal hida ? need gallbladder surgery Contact information: [place] [address] [phone]        Crist Infante, MD Follow up in 1 week(s).   Specialty:  Internal Medicine Contact information: [address] [place] [address] [phone]           Discharge Medications   Allergies  as of 02/15/2018   No Known Allergies     Medication List    TAKE these medications   amoxicillin-clavulanate 875-125 MG tablet Commonly known as:  AUGMENTIN Take 1 tablet by mouth 2 (two) times  daily for 7 days.   cyclobenzaprine 10 MG tablet Commonly known as:  FLEXERIL Take 10 mg by mouth 3 (three) times daily as needed for muscle spasms.   HYDROcodone-acetaminophen 5-325 MG tablet Commonly known as:  NORCO/VICODIN Take 1-2 tablets by mouth every 6 (six) hours as needed for pain.   pantoprazole 40 MG tablet Commonly known as:  PROTONIX Take 40 mg by mouth daily.   tadalafil 20 MG tablet Commonly known as:  ADCIRCA/CIALIS Take 1 tablet (20 mg total) by mouth daily as needed for erectile dysfunction.   temazepam 30 MG capsule Commonly known as:  RESTORIL Take 30 mg by mouth at bedtime as needed for sleep.          Total Time in preparing paper work, data evaluation and todays exam - 42 minutes  Dustin Flock M.D on 02/15/2018 at 2:53 PM [place]  [phone]

## 2018-02-17 LAB — HIV ANTIBODY (ROUTINE TESTING W REFLEX): HIV Screen 4th Generation wRfx: NONREACTIVE

## 2018-05-21 DIAGNOSIS — H2513 Age-related nuclear cataract, bilateral: Secondary | ICD-10-CM | POA: Diagnosis not present

## 2018-08-13 ENCOUNTER — Ambulatory Visit: Payer: PPO | Admitting: Urology

## 2018-10-29 ENCOUNTER — Ambulatory Visit: Payer: PPO | Admitting: Urology

## 2018-10-29 ENCOUNTER — Other Ambulatory Visit: Payer: Self-pay

## 2018-10-29 ENCOUNTER — Encounter: Payer: Self-pay | Admitting: Urology

## 2018-10-29 VITALS — BP 142/88 | HR 70 | Ht 76.0 in | Wt 186.0 lb

## 2018-10-29 DIAGNOSIS — N529 Male erectile dysfunction, unspecified: Secondary | ICD-10-CM | POA: Diagnosis not present

## 2018-10-29 MED ORDER — NONFORMULARY OR COMPOUNDED ITEM: 0 refills | Status: DC

## 2018-10-29 NOTE — Progress Notes (Signed)
   10/29/2018 10:12 AM   Addison Naegeli [birth year] [id number]  Reason for visit: Follow up erectile dysfunction  HPI: I saw Mr. Shannahan in urology clinic today in follow-up for erectile dysfunction with mild Peyronie's disease.  On exam, he did not have a definite palpable plaque.  He has a long history of erectile dysfunction, and has had some improvement with 20 mg Cialis on demand.  He reports this only works approximately 25% of the time.  He also has mild curvature with an approximately 15 degree curve.  He also occasionally uses a vacuum erection device with helps both with erection and the curvature.  He is interested in pursuing other options for erections, as he is not satisfied with PDE 5 inhibitors at this time.   ROS: Please see flowsheet from today's date for complete review of systems.  Physical Exam: BP (!) 142/88   Pulse 70   Ht 6\' 4"  (1.93 m)   Wt 186 lb (84.4 kg)   BMI 22.64 kg/m    Constitutional:  Alert and oriented, No acute distress. Respiratory: Normal respiratory effort, no increased work of breathing. GI: Abdomen is soft, nontender, nondistended, no abdominal masses Skin: No rashes, bruises or suspicious lesions. Neurologic: Grossly intact, no focal deficits, moving all 4 extremities. Psychiatric: Normal mood and affect   Assessment & Plan:   In summary, the patient is a 68 year old male with erectile dysfunction with minimal response to Cialis and vacuum erection device.  We discussed alternatives and risks/benefits at length including MUSE, penile injections, or penile prosthesis.  He is interested in pursuing penile injections.  His curvature is very mild and does not prevent intercourse.  RTC with Larene Beach in 1 to 2 weeks for Trimix teaching Consider referral for IPP in the future if patient desires  A total of 15 minutes were spent face-to-face with the patient, greater than 50% was spent in patient education, counseling, and coordination of care regarding  erectile dysfunction and Peyronie's disease.   Billey Co, [facility] [address], [place], [place] [address] [phone]

## 2018-10-29 NOTE — Patient Instructions (Signed)
Erectile Dysfunction Erectile dysfunction (ED) is the inability to get or keep an erection in order to have sexual intercourse. Erectile dysfunction may include:  Inability to get an erection.  Lack of enough hardness of the erection to allow penetration.  Loss of the erection before sex is finished. What are the causes? This condition may be caused by:  Certain medicines, such as: ? Pain relievers. ? Antihistamines. ? Antidepressants. ? Blood pressure medicines. ? Water pills (diuretics). ? Ulcer medicines. ? Muscle relaxants. ? Drugs.  Excessive drinking.  Psychological causes, such as: ? Anxiety. Depression. Penile Prosthesis Implantation Penile prosthesis implantation is a procedure to put a device that treats erectile dysfunction into the penis. There are two main types of devices that can be put in during the procedure: malleable penile implants and inflatable penile implants. Malleable penile implant A malleable penile implant, also called a non-hydraulic or semi-rigid implant, consists of two silicone rubber rods. The rods provide some rigidity. They are also flexible, so the penis can both curve downward in its normal position and become straight for sexual intercourse. Inflatable penile implant  An inflatable penile implant, also called a hydraulic implant, consists of cylinders, a pump, and a reservoir. The cylinders can be inflated with a fluid that helps to create an erection, and they can be deflated after intercourse. There are several types of inflatable implants. Tell a health care provider about:  Any allergies you have.  All medicines you are taking, including vitamins, herbs, eye drops, creams, and over-the-counter medicines.  Any problems you or family members have had with anesthetic medicines.  Any blood disorders you have.  Any surgeries you have had.  Any medical conditions you have. What are the risks? Generally, this is a safe procedure.  However, problems may occur, including:  Infection in the penis. If this happens, the implant may need to be removed.  Bleeding.  Allergic reaction to medicines.  Damage to other structures or organs, such as the tube that drains urine from the body (urethra).  Not enough blood reaching the penis. This is rare. If this happens, the implant will need to be removed. What happens before the procedure? Medicines  Ask your health care provider about: ? Changing or stopping your regular medicines. This is especially important if you are taking diabetes medicines or blood thinners. ? Taking medicines such as aspirin and ibuprofen. These medicines can thin your blood. Do not take these medicines before your procedure if your health care provider instructs you not to. Staying hydrated Follow instructions from your health care provider about hydration, which may include:  Up to 2 hours before the procedure - you may continue to drink clear liquids, such as water, clear fruit juice, black coffee, and plain tea. Eating and drinking restrictions Follow instructions from your health care provider about eating and drinking, which may include:  8 hours before the procedure - stop eating heavy meals or foods such as meat, fried foods, or fatty foods.  6 hours before the procedure - stop eating light meals or foods, such as toast or cereal.  6 hours before the procedure - stop drinking milk or drinks that contain milk.  2 hours before the procedure - stop drinking clear liquids. General instructions  You may be asked to shower with a germ-killing soap.  Plan to have someone take you home from the hospital or clinic.  If you will be going home right after the procedure, plan to have someone with you for  24 hours. What happens during the procedure?  To lower your risk of infection: ? Your health care team will wash or sanitize their hands. ? Hair may be removed from the surgical area. ? Your  skin will be washed with soap. ? You may be given antibiotic medicine.  An IV tube will be inserted into one of your veins.  You will be given one or more of the following: ? A medicine to make you fall asleep (general anesthetic). ? A medicine that is injected into your spine to numb the area below and slightly above the injection site (spinal anesthetic).  A flexible tube (catheter) may be inserted into your urethra and bladder. The catheter drains urine during the procedure and helps your surgeon easily locate your urethra.  A small incision will be made in your scrotum or in your penis, just below the head of your penis.  The cylinders of the prosthesis will be put into tissue on each side of your penis.  If you will have an inflatable penile implant: ? Incisions will be made in your abdomen and in your scrotum. These incisions will be used to insert the pump and the reservoir. ? The cylinders, reservoir, and pump will be joined by tubes and tested.  Your incision(s) will be closed with dissolvable stitches (sutures).  A bandage (dressing) will be applied to your incision(s).  You may be fitted with a device similar to a jock strap or underwear with a supportive pouch (scrotal support) to relieve pressure on the incision area. The procedure may vary among health care providers and hospitals. What happens after the procedure?  Your blood pressure, heart rate, breathing rate, and blood oxygen level will be monitored until the medicines you were given have worn off.  If you have a catheter in place, it may stay in place for the day after the procedure.  You may be given antibiotics or pain medicines as needed.  You may need to follow a clear liquid diet for the first 24 hours after the procedure.  You may be encouraged to sit up and walk around.  A towel roll or an ice pack may be placed under your scrotum to help reduce swelling.  Do not drive for 24 hours if you were given a  sedative. Summary  Penile prosthesis implantation is a procedure to put a device that treats erectile dysfunction into the penis.  There are two main types of devices that can be put in during the procedure: malleable penile implants and inflatable penile implants.  After the procedure, you may be fitted with a device similar to a jock strap or underwear with a supportive pouch (scrotal support) to relieve pressure on the incision area. This information is not intended to replace advice given to you by your health care provider. Make sure you discuss any questions you have with your health care provider. Document Released: 07/23/2007 Document Revised: 03/28/2017 Document Reviewed: 01/26/2016 Elsevier Patient Education  [employer]. ?  ? Sadness. ? Exhaustion. ? Performance fear. ? Stress.  Physical causes, such as: ? Artery problems. This may include diabetes, smoking, liver disease, or atherosclerosis. ? High blood pressure. ? Hormonal problems, such as low testosterone. ? Obesity. ? Nerve problems. This may include back or pelvic injuries, diabetes mellitus, multiple sclerosis, or Parkinson disease. What are the signs or symptoms? Symptoms of this condition include:  Inability to get an erection.  Lack of enough hardness of the erection to allow penetration.  Loss of the erection before sex is finished.  Normal erections at some times, but with frequent unsatisfactory episodes.  Low sexual satisfaction in either partner due to erection problems.  A curved penis occurring with erection. The curve may cause pain or the penis may be too curved to allow for intercourse.  Never having nighttime erections. How is this diagnosed? This condition is often diagnosed by:  Performing a physical exam to find other diseases or specific problems with the penis.  Asking you detailed questions about the problem.  Performing blood tests to check for diabetes mellitus or to  measure hormone levels.  Performing other tests to check for underlying health conditions.  Performing an ultrasound exam to check for scarring.  Performing a test to check blood flow to the penis.  Doing a sleep study at home to measure nighttime erections. How is this treated? This condition may be treated by:  Medicine taken by mouth to help you achieve an erection (oral medicine).  Hormone replacement therapy to replace low testosterone levels.  Medicine that is injected into the penis. Your health care provider may instruct you how to give yourself these injections at home.  Vacuum pump. This is a pump with a ring on it. The pump and ring are placed on the penis and used to create pressure that helps the penis become erect.  Penile implant surgery. In this procedure, you may receive: ? An inflatable implant. This consists of cylinders, a pump, and a reservoir. The cylinders can be inflated with a fluid that helps to create an erection, and they can be deflated after intercourse. ? A semi-rigid implant. This consists of two silicone rubber rods. The rods provide some rigidity. They are also flexible, so the penis can both curve downward in its normal position and become straight for sexual intercourse.  Blood vessel surgery, to improve blood flow to the penis. During this procedure, a blood vessel from a different part of the body is placed into the penis to allow blood to flow around (bypass) damaged or blocked blood vessels.  Lifestyle changes, such as exercising more, losing weight, and quitting smoking. Follow these instructions at home: Medicines   Take over-the-counter and prescription medicines only as told by your health care provider. Do not increase the dosage without first discussing it with your health care provider.  If you are using self-injections, perform injections as directed by your health care provider. Make sure to avoid any veins that are on the surface of  the penis. After giving an injection, apply pressure to the injection site for 5 minutes. General instructions  Exercise regularly, as directed by your health care provider. Work with your health care provider to lose weight, if needed.  Do not use any products that contain nicotine or tobacco, such as cigarettes and e-cigarettes. If you need help quitting, ask your health care provider.  Before using a vacuum pump, read the instructions that come with the pump and discuss any questions with your health care provider.  Keep all follow-up visits as told by your health care provider. This is important. Contact a health care provider if:  You feel nauseous.  You vomit. Get help right away if:  You are taking oral or injectable medicines and you have an erection that lasts longer than 4 hours. If your health care provider is unavailable, go to the nearest emergency room for evaluation. An erection that lasts much longer than 4 hours can result in permanent damage to  your penis.  You have severe pain in your groin or abdomen.  You develop redness or severe swelling of your penis.  You have redness spreading up into your groin or lower abdomen.  You are unable to urinate.  You experience chest pain or a rapid heart beat (palpitations) after taking oral medicines. Summary  Erectile dysfunction (ED) is the inability to get or keep an erection during sexual intercourse. This problem can usually be treated successfully.  This condition is diagnosed based on a physical exam, your symptoms, and tests to determine the cause. Treatment varies depending on the cause, and may include medicines, hormone therapy, surgery, or vacuum pump.  You may need follow-up visits to make sure that you are using your medicines or devices correctly.  Get help right away if you are taking or injecting medicines and you have an erection that lasts longer than 4 hours. This information is not intended to replace  advice given to you by your health care provider. Make sure you discuss any questions you have with your health care provider. Document Released: 04/12/2000 Document Revised: 03/28/2017 Document Reviewed: 05/01/2016 Elsevier Patient Education  2020 [employer].

## 2018-11-26 ENCOUNTER — Telehealth: Payer: Self-pay | Admitting: Family Medicine

## 2018-11-26 NOTE — Telephone Encounter (Signed)
LMOM for patient to call we just need to confirm patient is getting Trimix before his appointment day.

## 2018-12-02 NOTE — Progress Notes (Signed)
Patient could not find his Trimix medication, so he needed to reschedule.

## 2018-12-03 ENCOUNTER — Ambulatory Visit: Payer: PPO | Admitting: Urology

## 2018-12-03 ENCOUNTER — Encounter: Payer: Self-pay | Admitting: Urology

## 2018-12-03 ENCOUNTER — Other Ambulatory Visit: Payer: Self-pay

## 2018-12-03 VITALS — BP 156/91 | HR 81 | Ht 76.0 in | Wt 184.0 lb

## 2018-12-03 DIAGNOSIS — N529 Male erectile dysfunction, unspecified: Secondary | ICD-10-CM

## 2018-12-03 MED ORDER — NONFORMULARY OR COMPOUNDED ITEM: 0 refills | Status: DC

## 2018-12-03 NOTE — Addendum Note (Signed)
Addended by: Kyra Manges on: 12/03/2018 04:19 PM   Modules accepted: Orders

## 2018-12-04 DIAGNOSIS — Z125 Encounter for screening for malignant neoplasm of prostate: Secondary | ICD-10-CM | POA: Diagnosis not present

## 2018-12-04 DIAGNOSIS — E538 Deficiency of other specified B group vitamins: Secondary | ICD-10-CM | POA: Diagnosis not present

## 2018-12-04 DIAGNOSIS — E7849 Other hyperlipidemia: Secondary | ICD-10-CM | POA: Diagnosis not present

## 2018-12-04 DIAGNOSIS — R7301 Impaired fasting glucose: Secondary | ICD-10-CM | POA: Diagnosis not present

## 2018-12-08 ENCOUNTER — Other Ambulatory Visit: Payer: Self-pay

## 2018-12-08 ENCOUNTER — Ambulatory Visit (INDEPENDENT_AMBULATORY_CARE_PROVIDER_SITE_OTHER): Payer: PPO | Admitting: Urology

## 2018-12-08 ENCOUNTER — Encounter: Payer: Self-pay | Admitting: Urology

## 2018-12-08 VITALS — BP 132/78 | HR 82 | Ht 76.0 in | Wt 187.0 lb

## 2018-12-08 DIAGNOSIS — N529 Male erectile dysfunction, unspecified: Secondary | ICD-10-CM | POA: Diagnosis not present

## 2018-12-08 NOTE — Progress Notes (Signed)
Patient's left corpus cavernosum is identified.  An area near the base of the penis is cleansed with rubbing alcohol.  Careful to avoid the dorsal vein, 2 mcg of Trimix (papaverine 30 mg, phentolamine 1 mg and prostaglandin E1 10 mcg, Lot # [id number]  exp #01/17/2019) is injected at a 90 degree angle into the left corpus cavernosum near the base of the penis.  Patient experienced an erection in 15 minutes, but it was not firm enough for penetration.  We discussed continuing the titration in the office, but Dylan Long would like to continue the titration two days from now to avoid another injection.  He will inject 3 mcg of the medication into his right corpus cavernosum on Thursday morning and report to Monday the result that afternoon.  Advised patient of the condition of priapism, painful erection lasting for more than four hours, and to contact the office immediately or seek treatment in the ED

## 2018-12-10 ENCOUNTER — Telehealth: Payer: Self-pay | Admitting: Urology

## 2018-12-10 DIAGNOSIS — E538 Deficiency of other specified B group vitamins: Secondary | ICD-10-CM | POA: Diagnosis not present

## 2018-12-10 DIAGNOSIS — Z1331 Encounter for screening for depression: Secondary | ICD-10-CM | POA: Diagnosis not present

## 2018-12-10 DIAGNOSIS — N529 Male erectile dysfunction, unspecified: Secondary | ICD-10-CM | POA: Diagnosis not present

## 2018-12-10 DIAGNOSIS — N486 Induration penis plastica: Secondary | ICD-10-CM | POA: Diagnosis not present

## 2018-12-10 DIAGNOSIS — M1990 Unspecified osteoarthritis, unspecified site: Secondary | ICD-10-CM | POA: Diagnosis not present

## 2018-12-10 DIAGNOSIS — K219 Gastro-esophageal reflux disease without esophagitis: Secondary | ICD-10-CM | POA: Diagnosis not present

## 2018-12-10 DIAGNOSIS — H9190 Unspecified hearing loss, unspecified ear: Secondary | ICD-10-CM | POA: Diagnosis not present

## 2018-12-10 DIAGNOSIS — M549 Dorsalgia, unspecified: Secondary | ICD-10-CM | POA: Diagnosis not present

## 2018-12-10 DIAGNOSIS — F329 Major depressive disorder, single episode, unspecified: Secondary | ICD-10-CM | POA: Diagnosis not present

## 2018-12-10 DIAGNOSIS — E755 Other lipid storage disorders: Secondary | ICD-10-CM | POA: Diagnosis not present

## 2018-12-10 DIAGNOSIS — Z0000 Encounter for general adult medical examination without abnormal findings: Secondary | ICD-10-CM | POA: Diagnosis not present

## 2018-12-10 DIAGNOSIS — I350 Nonrheumatic aortic (valve) stenosis: Secondary | ICD-10-CM | POA: Diagnosis not present

## 2018-12-10 DIAGNOSIS — Z1339 Encounter for screening examination for other mental health and behavioral disorders: Secondary | ICD-10-CM | POA: Diagnosis not present

## 2018-12-10 DIAGNOSIS — Z8679 Personal history of other diseases of the circulatory system: Secondary | ICD-10-CM | POA: Diagnosis not present

## 2018-12-10 NOTE — Telephone Encounter (Signed)
Pt called and would like a call back to discuss his injection.

## 2018-12-10 NOTE — Telephone Encounter (Signed)
Attempted to return the call, but per wife he was outside and told her he would have to call me back.

## 2018-12-15 ENCOUNTER — Other Ambulatory Visit: Payer: Self-pay | Admitting: Internal Medicine

## 2018-12-15 DIAGNOSIS — E785 Hyperlipidemia, unspecified: Secondary | ICD-10-CM

## 2018-12-31 ENCOUNTER — Ambulatory Visit
Admission: RE | Admit: 2018-12-31 | Discharge: 2018-12-31 | Disposition: A | Discharge status: Home / Self Care | Payer: PPO | Source: Ambulatory Visit | Attending: Internal Medicine | Admitting: Internal Medicine

## 2018-12-31 DIAGNOSIS — E785 Hyperlipidemia, unspecified: Secondary | ICD-10-CM

## 2019-01-13 ENCOUNTER — Telehealth: Payer: Self-pay

## 2019-01-13 NOTE — Telephone Encounter (Signed)
Left message for patient to call the office to schedule appointment with dr. Martinique

## 2019-01-25 ENCOUNTER — Telehealth: Payer: Self-pay | Admitting: Cardiology

## 2019-01-25 NOTE — Telephone Encounter (Signed)
LVM for patient to call and schedule new patient appt with Dr. Martinique.

## 2019-01-26 ENCOUNTER — Telehealth: Payer: Self-pay | Admitting: Cardiology

## 2019-01-26 NOTE — Telephone Encounter (Signed)
LVM for patient to call and schedule new patient appt with Dr. Martinique.

## 2019-03-05 NOTE — Progress Notes (Signed)
Cardiology Office Note   Date:  03/05/2019   ID:  Dylan Long, DOB 1950-06-12, MRN [id number]  PCP:  Crist Infante, MD  Cardiologist:   Martinique, MD   No chief complaint on file.     History of Present Illness: Dylan Long is a 68 y.o. male who is see at the request of Dr Joylene Draft for evaluation of coronary calcification noted on CT. He has a history of a bicuspid AV. Last seen in 2013. Echocardiogram in 2010 showed a mean gradient of 9 mmHg, peak gradient of 17 mm mercury, and a valve area of 2.2 cm square. This was unchanged in 2013 and again in 2017. Reported stress test in 2010 was normal. Recently he had a coronary CT calcium score of 503 with 3 vessel calcification. This placed him in 77th percentile for age.   Patient reports he is in good health. 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 Works 10 hrs/day. Denies any chest pain, palpitations, dyspnea, fatigue. Only complaint is of sciatica in his right leg. He did have problems with muscle cramps on Crestor in the past. Now on Vytorin for the past month and tolerating well.    Past Medical History:  Diagnosis Date  . Aortic stenosis   . Aortic valve sclerosis    with functionally bicuspid aortic valve  . Dyslipidemia   . Pancreatitis     Past Surgical History:  Procedure Laterality Date  . LUMBAR FUSION       Current Outpatient Medications  Medication Sig Dispense Refill  . cyclobenzaprine (FLEXERIL) 10 MG tablet Take 10 mg by mouth 3 (three) times daily as needed for muscle spasms.     Marland Kitchen HYDROcodone-acetaminophen (NORCO/VICODIN) 5-325 MG tablet Take 1-2 tablets by mouth every 6 (six) hours as needed for pain.  0  . NONFORMULARY OR COMPOUNDED ITEM Trimix (30/1/10)-(Pap/Phent/PGE)  Dosage: test dose  Test Dose 78ml vial   [facility] [place] [phone] Fax [phone] 1 each 0  . pantoprazole (PROTONIX) 40 MG tablet Take 40 mg by mouth daily.    .  tadalafil (ADCIRCA/CIALIS) 20 MG tablet Take 1 tablet (20 mg total) by mouth daily as needed for erectile dysfunction. 30 tablet 0  . temazepam (RESTORIL) 30 MG capsule Take 30 mg by mouth at bedtime as needed for sleep.  4   No current facility-administered medications for this visit.     Allergies:   Patient has no known allergies.    Social History:  The patient  reports that he quit smoking about 19 years ago. His smoking use included cigarettes. He has never used smokeless tobacco. He reports current alcohol use. He reports current drug use. Drug: Marijuana.   Family History:  The patient's family history includes Heart attack in his father; Heart disease in his father; Hyperlipidemia in his brother; Stroke in his mother.    ROS:  Please see the history of present illness.   Otherwise, review of systems are positive for none.   All other systems are reviewed and negative.    PHYSICAL EXAM: VS:  There were no vitals taken for this visit. , BMI There is no height or weight on file to calculate BMI. GEN: Well nourished, well developed, in no acute distress  HEENT: normal  Neck: no JVD, carotid bruits, or masses Cardiac: RRR; no murmurs, rubs, or gallops,no edema  Respiratory:  clear to auscultation bilaterally, normal work of breathing GI: soft, nontender,  nondistended, + BS MS: no deformity or atrophy  Skin: warm and dry, no rash Neuro:  Strength and sensation are intact Psych: euthymic mood, full affect   EKG:  EKG is ordered today. The ekg ordered today demonstrates NSR with rate 62. Normal. I have personally reviewed and interpreted this study.    Recent Labs: No results found for requested labs within last 8760 hours.    Lipid Panel    Component Value Date/Time   CHOL 206 (H) 02/14/2018 0454   TRIG 89 02/14/2018 0454   HDL 66 02/14/2018 0454   CHOLHDL 3.1 02/14/2018 0454   VLDL 18 02/14/2018 0454   LDLCALC 122 (H) 02/14/2018 0454    Dated 12/04/18: cholesterol  263, triglycerides 112, HDL 60, LDL 181. A1c 5.25. creatinine 1.1. otherwise CBC, CMET and TSH normal.   Wt Readings from Last 3 Encounters:  12/08/18 187 lb (84.8 kg)  12/03/18 184 lb (83.5 kg)  10/29/18 186 lb (84.4 kg)      Other studies Reviewed: Additional studies/ records that were reviewed today include:  Echo 09/20/15: Study Conclusions  - Left ventricle: The cavity size was normal. Systolic function was   normal. The estimated ejection fraction was in the range of 50%   to 55%. Wall motion was normal; there were no regional wall   motion abnormalities. Doppler parameters are consistent with   abnormal left ventricular relaxation (grade 1 diastolic   dysfunction). - Aortic valve: Valve mobility was mildly restricted. There was   very mild stenosis. There was mild regurgitation. Peak velocity   (S): 203 cm/s. Mean gradient (S): 9 mm Hg. - Mitral valve: Transvalvular velocity was within the normal range.   There was no evidence for stenosis. There was trivial   regurgitation. - Right ventricle: The cavity size was normal. Wall thickness was   normal. Systolic function was normal. - Atrial septum: No defect or patent foramen ovale was identified   by color flow Doppler. - Tricuspid valve: There was trivial regurgitation. - Inferior vena cava: The vessel was normal in size. The   respirophasic diameter changes were blunted (< 50%), consistent   with normal central venous pressure.  TECHNIQUE: CT heart was performed on a 256 channel system using prospective ECG gating. A scout and noncontrast exam (for calcium scoring) were performed. Note that this exam targets the heart and the chest was not imaged in its entirety.  COMPARISON:  None  FINDINGS: Technical quality: Good  Lad, circumflex and RIGHT coronary artery  CORONARY CALCIUM  Total Agatston Score: 503  MESA database percentile: 77 th  Ascending aorta ( <  40 mm): 35 mm  EXTRACARDIAC  FINDINGS:  Limited view of the lung parenchyma demonstrates no suspicious nodularity. Airways are normal.  Limited view of the mediastinum demonstrates no adenopathy. Esophagus normal.  Limited view of the upper abdomen unremarkable.  Limited view of the skeleton and chest wall is unremarkable.  IMPRESSION: 1.  Three-vessel coronary artery calcification.  2. Total Agatston Score: 503  3. MESA age and sex matched database percentile: 77th   Electronically Signed   By: Suzy Bouchard M.D.   On: 12/31/2018 09:37   ASSESSMENT AND PLAN:  1.  Coronary calcification noted on CT. He is asymptomatic from a cardiac standpoint. He is quite active. We discussed symptoms of concern including chest pain, fatigue, SOB. At this point I would recommend aggressive risk factor modification. Goal LDL less than 70. He should take a baby ASA daily. He is  to call if he develops any symptoms.   2. Bicuspid AV- this has been stable for many years. Will plan on repeating Echo in one year and I will see him afterwards for follow up.  3. Hypercholesterolemia on Vytorin now. Goal LDL < 70.     Current medicines are reviewed at length with the patient today.  The patient does not have concerns regarding medicines.  The following changes have been made:  no change  Labs/ tests ordered today include:  No orders of the defined types were placed in this encounter.    Disposition:   FU with me in 1 year with Echo.  Signed,  Martin[place], MD  03/05/2019 6:54 AM    [facility] [address] [place], [place], [address] Phone [phone], Fax [phone]

## 2019-03-10 ENCOUNTER — Encounter: Payer: Self-pay | Admitting: Cardiology

## 2019-03-10 ENCOUNTER — Other Ambulatory Visit: Payer: Self-pay

## 2019-03-10 ENCOUNTER — Ambulatory Visit: Payer: PPO | Admitting: Cardiology

## 2019-03-10 VITALS — BP 124/80 | HR 67 | Temp 96.9°F | Ht 76.0 in | Wt 185.2 lb

## 2019-03-10 DIAGNOSIS — Q231 Congenital insufficiency of aortic valve: Secondary | ICD-10-CM | POA: Diagnosis not present

## 2019-03-10 DIAGNOSIS — I2510 Atherosclerotic heart disease of native coronary artery without angina pectoris: Secondary | ICD-10-CM

## 2019-03-10 DIAGNOSIS — I2584 Coronary atherosclerosis due to calcified coronary lesion: Secondary | ICD-10-CM

## 2019-03-10 DIAGNOSIS — E7800 Pure hypercholesterolemia, unspecified: Secondary | ICD-10-CM

## 2019-03-10 MED ORDER — EZETIMIBE-SIMVASTATIN 10-10 MG PO TABS: 1.0000 | ORAL_TABLET | Freq: Every day | ORAL | Status: AC

## 2019-03-10 NOTE — Patient Instructions (Signed)
Continue your current therapy  Eat healthy and stay active   We will follow up in one year after an Echocardiogram

## 2019-05-03 ENCOUNTER — Telehealth: Payer: Self-pay | Admitting: Urology

## 2019-05-03 MED ORDER — TADALAFIL 20 MG PO TABS: 20.0000 mg | ORAL_TABLET | Freq: Every day | ORAL | 0 refills | Status: DC | PRN

## 2019-05-03 NOTE — Telephone Encounter (Signed)
Pt requests a refill for Tadalafil.

## 2019-08-19 DIAGNOSIS — H2513 Age-related nuclear cataract, bilateral: Secondary | ICD-10-CM | POA: Diagnosis not present

## 2020-01-17 DIAGNOSIS — R7301 Impaired fasting glucose: Secondary | ICD-10-CM | POA: Diagnosis not present

## 2020-01-17 DIAGNOSIS — E538 Deficiency of other specified B group vitamins: Secondary | ICD-10-CM | POA: Diagnosis not present

## 2020-01-17 DIAGNOSIS — E785 Hyperlipidemia, unspecified: Secondary | ICD-10-CM | POA: Diagnosis not present

## 2020-01-17 DIAGNOSIS — Z125 Encounter for screening for malignant neoplasm of prostate: Secondary | ICD-10-CM | POA: Diagnosis not present

## 2020-01-24 DIAGNOSIS — I350 Nonrheumatic aortic (valve) stenosis: Secondary | ICD-10-CM | POA: Diagnosis not present

## 2020-01-24 DIAGNOSIS — N529 Male erectile dysfunction, unspecified: Secondary | ICD-10-CM | POA: Diagnosis not present

## 2020-01-24 DIAGNOSIS — I2510 Atherosclerotic heart disease of native coronary artery without angina pectoris: Secondary | ICD-10-CM | POA: Diagnosis not present

## 2020-01-24 DIAGNOSIS — M25559 Pain in unspecified hip: Secondary | ICD-10-CM | POA: Diagnosis not present

## 2020-01-24 DIAGNOSIS — R7301 Impaired fasting glucose: Secondary | ICD-10-CM | POA: Diagnosis not present

## 2020-01-24 DIAGNOSIS — E7849 Other hyperlipidemia: Secondary | ICD-10-CM | POA: Diagnosis not present

## 2020-01-24 DIAGNOSIS — E538 Deficiency of other specified B group vitamins: Secondary | ICD-10-CM | POA: Diagnosis not present

## 2020-01-24 DIAGNOSIS — Z23 Encounter for immunization: Secondary | ICD-10-CM | POA: Diagnosis not present

## 2020-01-24 DIAGNOSIS — Z0000 Encounter for general adult medical examination without abnormal findings: Secondary | ICD-10-CM | POA: Diagnosis not present

## 2020-01-24 DIAGNOSIS — R82998 Other abnormal findings in urine: Secondary | ICD-10-CM | POA: Diagnosis not present

## 2020-01-24 DIAGNOSIS — F329 Major depressive disorder, single episode, unspecified: Secondary | ICD-10-CM | POA: Diagnosis not present

## 2020-01-25 DIAGNOSIS — Z1212 Encounter for screening for malignant neoplasm of rectum: Secondary | ICD-10-CM | POA: Diagnosis not present

## 2020-02-25 DIAGNOSIS — M25551 Pain in right hip: Secondary | ICD-10-CM | POA: Diagnosis not present

## 2020-04-07 DIAGNOSIS — M25551 Pain in right hip: Secondary | ICD-10-CM | POA: Diagnosis not present

## 2020-04-07 DIAGNOSIS — M7061 Trochanteric bursitis, right hip: Secondary | ICD-10-CM | POA: Diagnosis not present

## 2020-05-26 DIAGNOSIS — M7061 Trochanteric bursitis, right hip: Secondary | ICD-10-CM | POA: Diagnosis not present

## 2020-05-26 NOTE — Progress Notes (Signed)
Cardiology Office Note   Date:  05/30/2020   ID:  Dylan Long, DOB 01-12-1951, MRN [id number]  PCP:  Rodrigo Ran, MD  Cardiologist:  Eathen Budreau [place], MD   Chief Complaint  Patient presents with  . Coronary Artery Disease      History of Present Illness: Dylan Long is a 70 y.o. male who is see at the request of Dr Waynard Edwards for evaluation of coronary calcification noted on CT. He has a history of a bicuspid AV. Last seen in 2013. Echocardiogram in 2010 showed a mean gradient of 9 mmHg, peak gradient of 17 mm mercury, and a valve area of 2.2 cm square. This was unchanged in 2013 and again in 2017. Reported stress test in 2010 was normal. Recently he had a coronary CT calcium score of 503 with 3 vessel calcification. This placed him in 77th percentile for age.   Patient reports he is in good health. 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 Denies any chest pain, palpitations, dyspnea, fatigue. Only complaint is of sciatica in his right leg and back problems. He did have problems with muscle cramps on Crestor in the past. Now on Vytorin  and tolerating well. Reports labs done in October with Dr Waynard Edwards   Past Medical History:  Diagnosis Date  . Aortic stenosis   . Aortic valve sclerosis    with functionally bicuspid aortic valve  . Dyslipidemia   . Hyperlipidemia   . Pancreatitis     Past Surgical History:  Procedure Laterality Date  . LUMBAR FUSION       Current Outpatient Medications  Medication Sig Dispense Refill  . cyclobenzaprine (FLEXERIL) 10 MG tablet Take 10 mg by mouth 3 (three) times daily as needed for muscle spasms.     Marland Kitchen ezetimibe-simvastatin (VYTORIN) 10-10 MG tablet Take 1 tablet by mouth at bedtime.    Marland Kitchen HYDROcodone-acetaminophen (NORCO/VICODIN) 5-325 MG tablet Take 1-2 tablets by mouth every 6 (six) hours as needed for pain.  0  . NONFORMULARY OR COMPOUNDED ITEM Trimix (30/1/10)-(Pap/Phent/PGE)  Dosage: test dose  Test  Dose 26ml vial   Qty1 Refills0  [facility] [phone] Fax [phone] 1 each 0  . pantoprazole (PROTONIX) 40 MG tablet Take 40 mg by mouth daily.    . tadalafil (CIALIS) 20 MG tablet Take 1 tablet (20 mg total) by mouth daily as needed for erectile dysfunction. 30 tablet 0  . temazepam (RESTORIL) 30 MG capsule Take 30 mg by mouth at bedtime as needed for sleep.  4   No current facility-administered medications for this visit.    Allergies:   Patient has no known allergies.    Social History:  The patient  reports that he quit smoking about 20 years ago. His smoking use included cigarettes. He has never used smokeless tobacco. He reports current alcohol use. He reports current drug use. Drug: Marijuana.   Family History:  The patient's family history includes Heart attack in his father; Heart disease in his father; Hyperlipidemia in his brother; Stroke in his mother.    ROS:  Please see the history of present illness.   Otherwise, review of systems are positive for none.   All other systems are reviewed and negative.    PHYSICAL EXAM: VS:  BP 127/71   Pulse 67   Ht 6' 3.5" (1.918 m)   Wt 186 lb (84.4 kg)   SpO2 98%   BMI 22.94 kg/m  , BMI Body  mass index is 22.94 kg/m. GEN: Well nourished, well developed, in no acute distress  HEENT: normal  Neck: no JVD, carotid bruits, or masses Cardiac: RRR; Gr 2/6 harsh systolic murmur RUSB radiating to apex, rubs, or gallops,no edema  Respiratory:  clear to auscultation bilaterally, normal work of breathing GI: soft, nontender, nondistended, + BS MS: no deformity or atrophy  Skin: warm and dry, no rash Neuro:  Strength and sensation are intact Psych: euthymic mood, full affect   EKG:  EKG is ordered today. The ekg ordered today demonstrates NSR with rate 67. Normal. I have personally reviewed and interpreted this study.    Recent Labs: No results found for requested labs within last 8760 hours.    Lipid Panel     Component Value Date/Time   CHOL 206 (H) 02/14/2018 0454   TRIG 89 02/14/2018 0454   HDL 66 02/14/2018 0454   CHOLHDL 3.1 02/14/2018 0454   VLDL 18 02/14/2018 0454   LDLCALC 122 (H) 02/14/2018 0454    Dated 12/04/18: cholesterol 263, triglycerides 112, HDL 60, LDL 181. A1c 5.25. creatinine 1.1. otherwise CBC, CMET and TSH normal.   Wt Readings from Last 3 Encounters:  05/30/20 186 lb (84.4 kg)  03/10/19 185 lb 3.2 oz (84 kg)  12/08/18 187 lb (84.8 kg)      Other studies Reviewed: Additional studies/ records that were reviewed today include:  Echo 09/20/15: Study Conclusions  - Left ventricle: The cavity size was normal. Systolic function was   normal. The estimated ejection fraction was in the range of 50%   to 55%. Wall motion was normal; there were no regional wall   motion abnormalities. Doppler parameters are consistent with   abnormal left ventricular relaxation (grade 1 diastolic   dysfunction). - Aortic valve: Valve mobility was mildly restricted. There was   very mild stenosis. There was mild regurgitation. Peak velocity   (S): 203 cm/s. Mean gradient (S): 9 mm Hg. - Mitral valve: Transvalvular velocity was within the normal range.   There was no evidence for stenosis. There was trivial   regurgitation. - Right ventricle: The cavity size was normal. Wall thickness was   normal. Systolic function was normal. - Atrial septum: No defect or patent foramen ovale was identified   by color flow Doppler. - Tricuspid valve: There was trivial regurgitation. - Inferior vena cava: The vessel was normal in size. The   respirophasic diameter changes were blunted (< 50%), consistent   with normal central venous pressure.  TECHNIQUE: CT heart was performed on a 256 channel system using prospective ECG gating. A scout and noncontrast exam (for calcium scoring) were performed. Note that this exam targets the heart and the chest was not imaged in its entirety.  COMPARISON:   None  FINDINGS: Technical quality: Good  Lad, circumflex and RIGHT coronary artery  CORONARY CALCIUM  Total Agatston Score: 503  MESA database percentile: 77 th  Ascending aorta ( <  40 mm): 35 mm  EXTRACARDIAC FINDINGS:  Limited view of the lung parenchyma demonstrates no suspicious nodularity. Airways are normal.  Limited view of the mediastinum demonstrates no adenopathy. Esophagus normal.  Limited view of the upper abdomen unremarkable.  Limited view of the skeleton and chest wall is unremarkable.  IMPRESSION: 1.  Three-vessel coronary artery calcification.  2. Total Agatston Score: 503  3. MESA age and sex matched database percentile: 77th   Electronically Signed   By: Suzy Bouchard M.D.   On: 12/31/2018 09:37  ASSESSMENT AND PLAN:  1.  Coronary calcification noted on CT. He is asymptomatic from a cardiac standpoint. He is quite active.  At this point I would recommend aggressive risk factor modification. Goal LDL less than 70. He should take a baby ASA daily. Will request a copy of his last lab work  2. Bicuspid AV- this has been stable for many years. Will plan on repeating Echo now since last Echo was May 2017.   3. Hypercholesterolemia on Vytorin now. Goal LDL < 70.     Current medicines are reviewed at length with the patient today.  The patient does not have concerns regarding medicines.  The following changes have been made:  no change  Labs/ tests ordered today include:  No orders of the defined types were placed in this encounter.    Disposition:   FU with me in 1 year  Signed, Tobi Leinweber [place], MD  05/30/2020 9:17 AM    [facility] [address], [place], [place], [address] Phone [phone], Fax [phone]

## 2020-05-30 ENCOUNTER — Encounter: Payer: Self-pay | Admitting: Cardiology

## 2020-05-30 ENCOUNTER — Other Ambulatory Visit: Payer: Self-pay

## 2020-05-30 ENCOUNTER — Ambulatory Visit: Payer: PPO | Admitting: Cardiology

## 2020-05-30 VITALS — BP 127/71 | HR 67 | Ht 75.5 in | Wt 186.0 lb

## 2020-05-30 DIAGNOSIS — Q231 Congenital insufficiency of aortic valve: Secondary | ICD-10-CM

## 2020-05-30 DIAGNOSIS — I2584 Coronary atherosclerosis due to calcified coronary lesion: Secondary | ICD-10-CM

## 2020-05-30 DIAGNOSIS — E7800 Pure hypercholesterolemia, unspecified: Secondary | ICD-10-CM | POA: Diagnosis not present

## 2020-05-30 DIAGNOSIS — I350 Nonrheumatic aortic (valve) stenosis: Secondary | ICD-10-CM

## 2020-05-30 DIAGNOSIS — I2510 Atherosclerotic heart disease of native coronary artery without angina pectoris: Secondary | ICD-10-CM | POA: Diagnosis not present

## 2020-05-30 NOTE — Patient Instructions (Addendum)
We will schedule you for an Echocardiogram  Take ASA 81 mg daily    Call in Nov to schedule Feb appointment

## 2020-05-30 NOTE — Addendum Note (Signed)
Addended by: Kathyrn Lass on: 05/30/2020 09:26 AM   Modules accepted: Orders

## 2020-06-21 ENCOUNTER — Ambulatory Visit (HOSPITAL_COMMUNITY): Payer: PPO | Attending: Cardiology

## 2020-06-21 ENCOUNTER — Other Ambulatory Visit: Payer: Self-pay

## 2020-06-21 DIAGNOSIS — I2584 Coronary atherosclerosis due to calcified coronary lesion: Secondary | ICD-10-CM

## 2020-06-21 DIAGNOSIS — I350 Nonrheumatic aortic (valve) stenosis: Secondary | ICD-10-CM | POA: Diagnosis not present

## 2020-06-21 DIAGNOSIS — E7800 Pure hypercholesterolemia, unspecified: Secondary | ICD-10-CM | POA: Diagnosis not present

## 2020-06-21 DIAGNOSIS — I2510 Atherosclerotic heart disease of native coronary artery without angina pectoris: Secondary | ICD-10-CM

## 2020-06-21 DIAGNOSIS — Q231 Congenital insufficiency of aortic valve: Secondary | ICD-10-CM | POA: Diagnosis not present

## 2020-06-21 LAB — ECHOCARDIOGRAM COMPLETE
AR max vel: 1.4 cm2
AV Area VTI: 1.32 cm2
AV Area mean vel: 1.31 cm2
AV Mean grad: 11.6 mmHg
AV Peak grad: 21.8 mmHg
Ao pk vel: 2.34 m/s
Area-P 1/2: 2.28 cm2
S' Lateral: 2.7 cm

## 2020-07-25 DIAGNOSIS — R7301 Impaired fasting glucose: Secondary | ICD-10-CM | POA: Diagnosis not present

## 2020-07-25 DIAGNOSIS — M5431 Sciatica, right side: Secondary | ICD-10-CM | POA: Diagnosis not present

## 2020-07-25 DIAGNOSIS — F329 Major depressive disorder, single episode, unspecified: Secondary | ICD-10-CM | POA: Diagnosis not present

## 2020-07-25 DIAGNOSIS — E785 Hyperlipidemia, unspecified: Secondary | ICD-10-CM | POA: Diagnosis not present

## 2020-07-25 DIAGNOSIS — I2510 Atherosclerotic heart disease of native coronary artery without angina pectoris: Secondary | ICD-10-CM | POA: Diagnosis not present

## 2020-07-25 DIAGNOSIS — I350 Nonrheumatic aortic (valve) stenosis: Secondary | ICD-10-CM | POA: Diagnosis not present

## 2020-07-25 DIAGNOSIS — E538 Deficiency of other specified B group vitamins: Secondary | ICD-10-CM | POA: Diagnosis not present

## 2020-07-25 DIAGNOSIS — M1990 Unspecified osteoarthritis, unspecified site: Secondary | ICD-10-CM | POA: Diagnosis not present

## 2020-07-25 DIAGNOSIS — K219 Gastro-esophageal reflux disease without esophagitis: Secondary | ICD-10-CM | POA: Diagnosis not present

## 2020-08-07 ENCOUNTER — Other Ambulatory Visit: Payer: Self-pay | Admitting: Internal Medicine

## 2020-08-07 DIAGNOSIS — M5431 Sciatica, right side: Secondary | ICD-10-CM

## 2020-08-20 ENCOUNTER — Other Ambulatory Visit: Payer: Self-pay

## 2020-08-20 ENCOUNTER — Ambulatory Visit
Admission: RE | Admit: 2020-08-20 | Discharge: 2020-08-20 | Disposition: A | Discharge status: Home / Self Care | Payer: PPO | Source: Ambulatory Visit | Attending: Internal Medicine | Admitting: Internal Medicine

## 2020-08-20 DIAGNOSIS — M545 Low back pain, unspecified: Secondary | ICD-10-CM | POA: Diagnosis not present

## 2020-08-20 DIAGNOSIS — M48061 Spinal stenosis, lumbar region without neurogenic claudication: Secondary | ICD-10-CM | POA: Diagnosis not present

## 2020-08-20 DIAGNOSIS — M5431 Sciatica, right side: Secondary | ICD-10-CM

## 2020-08-28 ENCOUNTER — Other Ambulatory Visit: Payer: Self-pay | Admitting: Internal Medicine

## 2020-08-28 DIAGNOSIS — M5136 Other intervertebral disc degeneration, lumbar region: Secondary | ICD-10-CM

## 2020-09-07 ENCOUNTER — Ambulatory Visit
Admission: RE | Admit: 2020-09-07 | Discharge: 2020-09-07 | Disposition: A | Discharge status: Home / Self Care | Payer: PPO | Source: Ambulatory Visit | Attending: Internal Medicine | Admitting: Internal Medicine

## 2020-09-07 ENCOUNTER — Other Ambulatory Visit: Payer: Self-pay | Admitting: Internal Medicine

## 2020-09-07 ENCOUNTER — Other Ambulatory Visit: Payer: Self-pay

## 2020-09-07 DIAGNOSIS — M5136 Other intervertebral disc degeneration, lumbar region: Secondary | ICD-10-CM

## 2020-09-07 DIAGNOSIS — M47817 Spondylosis without myelopathy or radiculopathy, lumbosacral region: Secondary | ICD-10-CM | POA: Diagnosis not present

## 2020-09-07 MED ORDER — METHYLPREDNISOLONE ACETATE 40 MG/ML INJ SUSP (RADIOLOG
80.0000 mg | Freq: Once | INTRAMUSCULAR | Status: AC
  Administered 2020-09-07: 80 mg via EPIDURAL

## 2020-09-07 MED ORDER — IOPAMIDOL (ISOVUE-M 200) INJECTION 41%
1.0000 mL | Freq: Once | INTRAMUSCULAR | Status: AC
  Administered 2020-09-07: 1 mL via EPIDURAL

## 2020-09-07 NOTE — Discharge Instructions (Signed)

## 2020-12-06 DIAGNOSIS — L820 Inflamed seborrheic keratosis: Secondary | ICD-10-CM | POA: Diagnosis not present

## 2020-12-06 DIAGNOSIS — D485 Neoplasm of uncertain behavior of skin: Secondary | ICD-10-CM | POA: Diagnosis not present

## 2021-02-21 DIAGNOSIS — R7301 Impaired fasting glucose: Secondary | ICD-10-CM | POA: Diagnosis not present

## 2021-02-21 DIAGNOSIS — E785 Hyperlipidemia, unspecified: Secondary | ICD-10-CM | POA: Diagnosis not present

## 2021-02-21 DIAGNOSIS — Z125 Encounter for screening for malignant neoplasm of prostate: Secondary | ICD-10-CM | POA: Diagnosis not present

## 2021-02-21 DIAGNOSIS — E538 Deficiency of other specified B group vitamins: Secondary | ICD-10-CM | POA: Diagnosis not present

## 2021-02-28 DIAGNOSIS — M5136 Other intervertebral disc degeneration, lumbar region: Secondary | ICD-10-CM | POA: Diagnosis not present

## 2021-02-28 DIAGNOSIS — Z23 Encounter for immunization: Secondary | ICD-10-CM | POA: Diagnosis not present

## 2021-02-28 DIAGNOSIS — I2510 Atherosclerotic heart disease of native coronary artery without angina pectoris: Secondary | ICD-10-CM | POA: Diagnosis not present

## 2021-02-28 DIAGNOSIS — E785 Hyperlipidemia, unspecified: Secondary | ICD-10-CM | POA: Diagnosis not present

## 2021-02-28 DIAGNOSIS — R82998 Other abnormal findings in urine: Secondary | ICD-10-CM | POA: Diagnosis not present

## 2021-02-28 DIAGNOSIS — Z1331 Encounter for screening for depression: Secondary | ICD-10-CM | POA: Diagnosis not present

## 2021-02-28 DIAGNOSIS — I350 Nonrheumatic aortic (valve) stenosis: Secondary | ICD-10-CM | POA: Diagnosis not present

## 2021-02-28 DIAGNOSIS — N529 Male erectile dysfunction, unspecified: Secondary | ICD-10-CM | POA: Diagnosis not present

## 2021-02-28 DIAGNOSIS — M1990 Unspecified osteoarthritis, unspecified site: Secondary | ICD-10-CM | POA: Diagnosis not present

## 2021-02-28 DIAGNOSIS — M791 Myalgia, unspecified site: Secondary | ICD-10-CM | POA: Diagnosis not present

## 2021-02-28 DIAGNOSIS — E538 Deficiency of other specified B group vitamins: Secondary | ICD-10-CM | POA: Diagnosis not present

## 2021-02-28 DIAGNOSIS — Z1212 Encounter for screening for malignant neoplasm of rectum: Secondary | ICD-10-CM | POA: Diagnosis not present

## 2021-02-28 DIAGNOSIS — Z0000 Encounter for general adult medical examination without abnormal findings: Secondary | ICD-10-CM | POA: Diagnosis not present

## 2021-02-28 DIAGNOSIS — R7301 Impaired fasting glucose: Secondary | ICD-10-CM | POA: Diagnosis not present

## 2021-04-10 DIAGNOSIS — R030 Elevated blood-pressure reading, without diagnosis of hypertension: Secondary | ICD-10-CM | POA: Diagnosis not present

## 2021-04-10 DIAGNOSIS — M5441 Lumbago with sciatica, right side: Secondary | ICD-10-CM | POA: Diagnosis not present

## 2021-04-10 DIAGNOSIS — G8929 Other chronic pain: Secondary | ICD-10-CM | POA: Diagnosis not present

## 2021-04-11 DIAGNOSIS — Z1212 Encounter for screening for malignant neoplasm of rectum: Secondary | ICD-10-CM | POA: Diagnosis not present

## 2021-05-22 ENCOUNTER — Encounter: Payer: Self-pay | Admitting: Internal Medicine

## 2021-05-27 DIAGNOSIS — E785 Hyperlipidemia, unspecified: Secondary | ICD-10-CM | POA: Diagnosis not present

## 2021-05-27 DIAGNOSIS — I2510 Atherosclerotic heart disease of native coronary artery without angina pectoris: Secondary | ICD-10-CM | POA: Diagnosis not present

## 2021-05-27 DIAGNOSIS — M1990 Unspecified osteoarthritis, unspecified site: Secondary | ICD-10-CM | POA: Diagnosis not present

## 2021-06-04 DIAGNOSIS — Z1211 Encounter for screening for malignant neoplasm of colon: Secondary | ICD-10-CM | POA: Diagnosis not present

## 2021-11-26 DIAGNOSIS — K219 Gastro-esophageal reflux disease without esophagitis: Secondary | ICD-10-CM | POA: Diagnosis not present

## 2021-11-26 DIAGNOSIS — M1990 Unspecified osteoarthritis, unspecified site: Secondary | ICD-10-CM | POA: Diagnosis not present

## 2021-11-26 DIAGNOSIS — F329 Major depressive disorder, single episode, unspecified: Secondary | ICD-10-CM | POA: Diagnosis not present

## 2021-11-26 DIAGNOSIS — E785 Hyperlipidemia, unspecified: Secondary | ICD-10-CM | POA: Diagnosis not present

## 2022-03-20 DIAGNOSIS — I350 Nonrheumatic aortic (valve) stenosis: Secondary | ICD-10-CM | POA: Diagnosis not present

## 2022-03-20 DIAGNOSIS — Z1211 Encounter for screening for malignant neoplasm of colon: Secondary | ICD-10-CM | POA: Diagnosis not present

## 2022-03-20 DIAGNOSIS — R7301 Impaired fasting glucose: Secondary | ICD-10-CM | POA: Diagnosis not present

## 2022-03-20 DIAGNOSIS — Z125 Encounter for screening for malignant neoplasm of prostate: Secondary | ICD-10-CM | POA: Diagnosis not present

## 2022-03-20 DIAGNOSIS — E785 Hyperlipidemia, unspecified: Secondary | ICD-10-CM | POA: Diagnosis not present

## 2022-03-20 DIAGNOSIS — E538 Deficiency of other specified B group vitamins: Secondary | ICD-10-CM | POA: Diagnosis not present

## 2022-03-27 DIAGNOSIS — N486 Induration penis plastica: Secondary | ICD-10-CM | POA: Diagnosis not present

## 2022-03-27 DIAGNOSIS — I2510 Atherosclerotic heart disease of native coronary artery without angina pectoris: Secondary | ICD-10-CM | POA: Diagnosis not present

## 2022-03-27 DIAGNOSIS — R7301 Impaired fasting glucose: Secondary | ICD-10-CM | POA: Diagnosis not present

## 2022-03-27 DIAGNOSIS — M1990 Unspecified osteoarthritis, unspecified site: Secondary | ICD-10-CM | POA: Diagnosis not present

## 2022-03-27 DIAGNOSIS — E785 Hyperlipidemia, unspecified: Secondary | ICD-10-CM | POA: Diagnosis not present

## 2022-03-27 DIAGNOSIS — E538 Deficiency of other specified B group vitamins: Secondary | ICD-10-CM | POA: Diagnosis not present

## 2022-03-27 DIAGNOSIS — Z1339 Encounter for screening examination for other mental health and behavioral disorders: Secondary | ICD-10-CM | POA: Diagnosis not present

## 2022-03-27 DIAGNOSIS — R82998 Other abnormal findings in urine: Secondary | ICD-10-CM | POA: Diagnosis not present

## 2022-03-27 DIAGNOSIS — Z23 Encounter for immunization: Secondary | ICD-10-CM | POA: Diagnosis not present

## 2022-03-27 DIAGNOSIS — K859 Acute pancreatitis without necrosis or infection, unspecified: Secondary | ICD-10-CM | POA: Diagnosis not present

## 2022-03-27 DIAGNOSIS — M5136 Other intervertebral disc degeneration, lumbar region: Secondary | ICD-10-CM | POA: Diagnosis not present

## 2022-03-27 DIAGNOSIS — Z1331 Encounter for screening for depression: Secondary | ICD-10-CM | POA: Diagnosis not present

## 2022-03-27 DIAGNOSIS — I350 Nonrheumatic aortic (valve) stenosis: Secondary | ICD-10-CM | POA: Diagnosis not present

## 2022-03-27 DIAGNOSIS — Z0000 Encounter for general adult medical examination without abnormal findings: Secondary | ICD-10-CM | POA: Diagnosis not present

## 2023-01-14 DIAGNOSIS — S0501XA Injury of conjunctiva and corneal abrasion without foreign body, right eye, initial encounter: Secondary | ICD-10-CM | POA: Diagnosis not present

## 2023-02-17 ENCOUNTER — Other Ambulatory Visit: Payer: Self-pay

## 2023-02-17 ENCOUNTER — Encounter: Payer: Self-pay | Admitting: Emergency Medicine

## 2023-02-17 ENCOUNTER — Emergency Department
Admission: EM | Admit: 2023-02-17 | Discharge: 2023-02-17 | Disposition: A | Discharge status: Home / Self Care | Payer: PPO | Attending: Emergency Medicine | Admitting: Emergency Medicine

## 2023-02-17 ENCOUNTER — Emergency Department: Payer: PPO

## 2023-02-17 DIAGNOSIS — R1013 Epigastric pain: Secondary | ICD-10-CM | POA: Insufficient documentation

## 2023-02-17 DIAGNOSIS — R079 Chest pain, unspecified: Secondary | ICD-10-CM

## 2023-02-17 DIAGNOSIS — R0789 Other chest pain: Secondary | ICD-10-CM | POA: Diagnosis not present

## 2023-02-17 LAB — BASIC METABOLIC PANEL
Anion gap: 8 (ref 5–15)
BUN: 13 mg/dL (ref 8–23)
CO2: 26 mmol/L (ref 22–32)
Calcium: 9 mg/dL (ref 8.9–10.3)
Chloride: 98 mmol/L (ref 98–111)
Creatinine, Ser: 1.12 mg/dL (ref 0.61–1.24)
GFR, Estimated: >60 mL/min (ref >60)
Glucose, Bld: 134 mg/dL — ABNORMAL HIGH (ref 70–99)
Potassium: 4.9 mmol/L (ref 3.5–5.1)
Sodium: 132 mmol/L — ABNORMAL LOW (ref 135–145)

## 2023-02-17 LAB — CBC
HCT: 47.2 % (ref 39.0–52.0)
Hemoglobin: 15.9 g/dL (ref 13.0–17.0)
MCH: 33 pg (ref 26.0–34.0)
MCHC: 33.7 g/dL (ref 30.0–36.0)
MCV: 97.9 fL (ref 80.0–100.0)
Platelets: 210 10*3/uL (ref 150–400)
RBC: 4.82 MIL/uL (ref 4.22–5.81)
RDW: 12 % (ref 11.5–15.5)
WBC: 6.2 10*3/uL (ref 4.0–10.5)
nRBC: 0 % (ref 0.0–0.2)

## 2023-02-17 LAB — HEPATIC FUNCTION PANEL
ALT: 20 U/L (ref 0–44)
AST: 24 U/L (ref 15–41)
Albumin: 4.2 g/dL (ref 3.5–5.0)
Alkaline Phosphatase: 50 U/L (ref 38–126)
Bilirubin, Direct: <0.1 mg/dL (ref 0.0–0.2)
Total Bilirubin: 0.6 mg/dL (ref 0.3–1.2)
Total Protein: 7.4 g/dL (ref 6.5–8.1)

## 2023-02-17 LAB — LIPASE, BLOOD: Lipase: 30 U/L (ref 11–51)

## 2023-02-17 LAB — TROPONIN I (HIGH SENSITIVITY): Troponin I (High Sensitivity): 7 ng/L (ref <18)

## 2023-02-17 NOTE — Discharge Instructions (Signed)
Continue taking your antacid medication as prescribed by your regular doctor every day.  If you have a flareup of your upper abdominal pain, you can take an additional antacid like Maalox or Pepto-Bismol.  I made a referral to a gastroenterologist will call you for a follow-up appointment.  Thank you for choosing [place] for your health care today!  Please see your primary doctor this week for a follow up appointment.   If you have any new, worsening, or unexpected symptoms call your doctor right away or come back to the emergency department for reevaluation.  As we discussed, I think your alcohol use contributes both to your recurrent pancreatitis and stomach acid problems.  When you are ready to reduce your drinking, see resources that have attached that can help people with alcohol treatment.  It was my pleasure to care for you today.   Daneil Dan Modesto Charon, MD

## 2023-02-17 NOTE — ED Provider Notes (Signed)
[facility] Provider Note    Event Date/Time   First MD Initiated Contact with Patient 02/17/23 1143     (approximate)   History   Chest Pain   HPI  Dylan Long is a 72 y.o. male   Past medical history of colic pancreatitis and ongoing alcohol use who presents to the emergency department with epigastric pain.  He drinks 6 beers a day.  His epigastric pain started yesterday.  No radiation of pain.  It got better with antacid.  He denies vomiting and his bowel movements been normal with no GI bleeding.  He denies chest pain.  He denies any respiratory complaints.  Independent Historian contributed to assessment above: His family members at bedside to corroborate information past medical history as above  External Medical Documents Reviewed: Prior hospitalizations for alcoholic pancreatitis      Physical Exam   Triage Vital Signs: ED Triage Vitals  Encounter Vitals Group     BP 02/17/23 1005 (!) 178/92     Systolic BP Percentile --      Diastolic BP Percentile --      Pulse Rate 02/17/23 1005 70     Resp 02/17/23 1005 16     Temp 02/17/23 1005 98.2 F (36.8 C)     Temp Source 02/17/23 1005 Oral     SpO2 02/17/23 1005 98 %     Weight 02/17/23 0958 182 lb (82.6 kg)     Height 02/17/23 0958 6\' 4"  (1.93 m)     Head Circumference --      Peak Flow --      Pain Score 02/17/23 0957 8     Pain Loc --      Pain Education --      Exclude from Growth Chart --     Most recent vital signs: Vitals:   02/17/23 1005  BP: (!) 178/92  Pulse: 70  Resp: 16  Temp: 98.2 F (36.8 C)  SpO2: 98%    General: Awake, no distress.  CV:  Good peripheral perfusion.  Resp:  Normal effort.  Abd:  No distention.  Other:  Hypertensive otherwise vital signs normal.  Clear lungs.  Abdomen with mild epigastric tenderness to palpation.   ED Results / Procedures / Treatments   Labs (all labs ordered are listed, but only abnormal results are displayed) Labs  Reviewed  BASIC METABOLIC PANEL - Abnormal; Notable for the following components:      Result Value   Sodium 132 (*)    Glucose, Bld 134 (*)    All other components within normal limits  CBC  HEPATIC FUNCTION PANEL  LIPASE, BLOOD  TROPONIN I (HIGH SENSITIVITY)     I ordered and reviewed the above labs they are notable for LFTs and lipase within normal limits, white blood cell count normal.  EKG  ED ECG REPORT I, Pilar Jarvis, the attending physician, personally viewed and interpreted this ECG.   Date: 02/17/2023  EKG Time: 1005  Rate: 66  Rhythm: sinus  Axis: nl  Intervals:1st deg av block  ST&T Change: no stemi    RADIOLOGY I independently reviewed and interpreted chest x-ray and I see no obvious focality or pneumothorax I also reviewed radiologist's formal read.   PROCEDURES:  Critical Care performed: No  Procedures   MEDICATIONS ORDERED IN ED: Medications - No data to display  IMPRESSION / MDM / ASSESSMENT AND PLAN / ED COURSE  I reviewed the triage vital signs and the  nursing notes.                                Patient's presentation is most consistent with acute presentation with potential threat to life or bodily function.  Differential diagnosis includes, but is not limited to, pancreatitis, cholecystitis, GERD/gastritis, gastric ulcer, ACS   The patient is on the cardiac monitor to evaluate for evidence of arrhythmia and/or significant heart rate changes.  MDM:    History of alcoholic pancreatitis with epigastric pain check lipase LFTs basic labs suspect pancreatitis.  Lipase within normal limits.  Suspect gastritis/peptic ulcer/GERD given epigastric pain alcohol use and report that he ate a large spicy meal yesterday.  Symptoms largely resolved with antacid medication.  Benign abdominal exam today rules against surgical abdominal pathologies.  I considered ACS but think less likely given epigastric pain tenderness palpation, with nonischemic EKG  and initial troponin is negative.  He will continue with his PPI, referral to gastroenterology, discharged with return precautions of any new or worsening symptoms.      FINAL CLINICAL IMPRESSION(S) / ED DIAGNOSES   Final diagnoses:  Nonspecific chest pain  Epigastric pain     Rx / DC Orders   ED Discharge Orders          Ordered    Ambulatory referral to Gastroenterology        02/17/23 1220             Note:  This document was prepared using Dragon voice recognition software and may include unintentional dictation errors.    Pilar Jarvis, MD 02/17/23 [phone]

## 2023-02-17 NOTE — ED Triage Notes (Signed)
Pt ambulatory to triage for central chest pain that started this morning. Hx of pancreatitis and believes it is another flare up. Mild nausea.

## 2023-05-13 DIAGNOSIS — L570 Actinic keratosis: Secondary | ICD-10-CM | POA: Diagnosis not present

## 2023-05-13 DIAGNOSIS — L309 Dermatitis, unspecified: Secondary | ICD-10-CM | POA: Diagnosis not present

## 2023-05-13 DIAGNOSIS — L821 Other seborrheic keratosis: Secondary | ICD-10-CM | POA: Diagnosis not present

## 2023-06-10 DIAGNOSIS — Z125 Encounter for screening for malignant neoplasm of prostate: Secondary | ICD-10-CM | POA: Diagnosis not present

## 2023-06-10 DIAGNOSIS — I2510 Atherosclerotic heart disease of native coronary artery without angina pectoris: Secondary | ICD-10-CM | POA: Diagnosis not present

## 2023-06-10 DIAGNOSIS — E538 Deficiency of other specified B group vitamins: Secondary | ICD-10-CM | POA: Diagnosis not present

## 2023-06-10 DIAGNOSIS — E785 Hyperlipidemia, unspecified: Secondary | ICD-10-CM | POA: Diagnosis not present

## 2023-06-10 DIAGNOSIS — Z1212 Encounter for screening for malignant neoplasm of rectum: Secondary | ICD-10-CM | POA: Diagnosis not present

## 2023-06-17 DIAGNOSIS — K829 Disease of gallbladder, unspecified: Secondary | ICD-10-CM | POA: Diagnosis not present

## 2023-06-17 DIAGNOSIS — N486 Induration penis plastica: Secondary | ICD-10-CM | POA: Diagnosis not present

## 2023-06-17 DIAGNOSIS — E785 Hyperlipidemia, unspecified: Secondary | ICD-10-CM | POA: Diagnosis not present

## 2023-06-17 DIAGNOSIS — R7301 Impaired fasting glucose: Secondary | ICD-10-CM | POA: Diagnosis not present

## 2023-06-17 DIAGNOSIS — M5136 Other intervertebral disc degeneration, lumbar region with discogenic back pain only: Secondary | ICD-10-CM | POA: Diagnosis not present

## 2023-06-17 DIAGNOSIS — R82998 Other abnormal findings in urine: Secondary | ICD-10-CM | POA: Diagnosis not present

## 2023-06-17 DIAGNOSIS — F1190 Opioid use, unspecified, uncomplicated: Secondary | ICD-10-CM | POA: Diagnosis not present

## 2023-06-17 DIAGNOSIS — I2510 Atherosclerotic heart disease of native coronary artery without angina pectoris: Secondary | ICD-10-CM | POA: Diagnosis not present

## 2023-06-17 DIAGNOSIS — Z23 Encounter for immunization: Secondary | ICD-10-CM | POA: Diagnosis not present

## 2023-06-17 DIAGNOSIS — I5189 Other ill-defined heart diseases: Secondary | ICD-10-CM | POA: Diagnosis not present

## 2023-06-17 DIAGNOSIS — I350 Nonrheumatic aortic (valve) stenosis: Secondary | ICD-10-CM | POA: Diagnosis not present

## 2023-06-17 DIAGNOSIS — Z0000 Encounter for general adult medical examination without abnormal findings: Secondary | ICD-10-CM | POA: Diagnosis not present

## 2023-06-17 DIAGNOSIS — E538 Deficiency of other specified B group vitamins: Secondary | ICD-10-CM | POA: Diagnosis not present

## 2023-10-29 DIAGNOSIS — H04123 Dry eye syndrome of bilateral lacrimal glands: Secondary | ICD-10-CM | POA: Diagnosis not present

## 2023-10-29 DIAGNOSIS — H2513 Age-related nuclear cataract, bilateral: Secondary | ICD-10-CM | POA: Diagnosis not present

## 2023-10-29 DIAGNOSIS — D3131 Benign neoplasm of right choroid: Secondary | ICD-10-CM | POA: Diagnosis not present

## 2023-10-29 DIAGNOSIS — H53002 Unspecified amblyopia, left eye: Secondary | ICD-10-CM | POA: Diagnosis not present

## 2023-11-28 DIAGNOSIS — D3131 Benign neoplasm of right choroid: Secondary | ICD-10-CM | POA: Diagnosis not present

## 2023-11-28 DIAGNOSIS — H2513 Age-related nuclear cataract, bilateral: Secondary | ICD-10-CM | POA: Diagnosis not present

## 2023-11-28 DIAGNOSIS — H53002 Unspecified amblyopia, left eye: Secondary | ICD-10-CM | POA: Diagnosis not present

## 2023-11-28 DIAGNOSIS — H43811 Vitreous degeneration, right eye: Secondary | ICD-10-CM | POA: Diagnosis not present

## 2023-12-05 ENCOUNTER — Encounter: Payer: Self-pay | Admitting: Ophthalmology

## 2023-12-08 ENCOUNTER — Encounter: Payer: Self-pay | Admitting: Ophthalmology

## 2023-12-08 NOTE — Anesthesia Preprocedure Evaluation (Signed)
 Anesthesia Evaluation  Patient identified by MRN, date of birth, ID band Patient awake    Reviewed: Allergy & Precautions, H&P , NPO status , Patient's Chart, lab work & pertinent test results  Airway Mallampati: III  TM Distance: <3 FB Neck ROM: Full    Dental no notable dental hx. (+) Implants, Caps Multiple caps, crowns, veneers, implants, not sure on which teeth:   Pulmonary former smoker   Pulmonary exam normal breath sounds clear to auscultation       Cardiovascular + CAD  negative cardio ROS Normal cardiovascular exam+ Valvular Problems/Murmurs  Rhythm:Regular Rate:Normal  06-21-20 echo 1. Left ventricular ejection fraction, by estimation, is 60 to 65%. The  left ventricle has normal function. The left ventricle has no regional  wall motion abnormalities. There is mild concentric left ventricular  hypertrophy. Left ventricular diastolic  parameters are consistent with Grade I diastolic dysfunction (impaired  relaxation).   2. Right ventricular systolic function is normal. The right ventricular  size is normal.   3. The mitral valve is normal in structure. Trivial mitral valve  regurgitation.   4. The aortic valve is calcified. There is moderate calcification of the  aortic valve. There is moderate thickening of the aortic valve. Aortic  valve regurgitation is trivial.   5. There is mild aortic stenosis with AVA 1.4 by continuity, mean  gradient , peak gradient , DI 0.43..   6. Aortic dilatation noted. There is mild dilatation of the ascending  aorta, measuring 39 mm. There is borderline dilatation of the aortic root,  measuring 36 mm.   7. The inferior vena cava is normal in size with greater than 50%  respiratory variability, suggesting right atrial pressure of 3 mmHg.   Comparison(s): Compared to prior TTE in 2017, the aortic stenosis is still  mild with mean gradient . Trivial AI. Ascending aorta  measures 39mm  and aortic root measures 36mm.     Neuro/Psych  PSYCHIATRIC DISORDERS       Neuromuscular disease negative neurological ROS  negative psych ROS   GI/Hepatic negative GI ROS, Neg liver ROS,GERD  ,,  Endo/Other  negative endocrine ROS    Renal/GU negative Renal ROS  negative genitourinary   Musculoskeletal negative musculoskeletal ROS (+) Arthritis ,    Abdominal   Peds negative pediatric ROS (+)  Hematology negative hematology ROS (+)   Anesthesia Other Findings Prior hospitalizations for alcoholic pancreatitis Drinks 6 beers per day--states he drank one beer last night.  Occasional THC.  Stopped smoking 25 years ago  Aortic stenosis  Dyslipidemia Aortic valve sclerosis  Pancreatitis Hyperlipidemia  Congenital Heart murmur GERD (gastroesophageal reflux disease) Neuromuscular disorder (HCC) Arthritis  History of heart murmur in childhood Bicuspid aortic valve  History of lumbar fusion Alcohol  consumption of more than four drinks per day  Alcoholic pancreatitis Alcohol  abuse Grade I diastolic dysfunction Mild aortic stenosis by prior echocardiogram     Reproductive/Obstetrics negative OB ROS                              Anesthesia Physical Anesthesia Plan  ASA: 4  Anesthesia Plan: MAC   Post-op Pain Management:    Induction: Intravenous  PONV Risk Score and Plan:   Airway Management Planned: Natural Airway and Nasal Cannula  Additional Equipment:   Intra-op Plan:   Post-operative Plan:   Informed Consent: I have reviewed the patients History and Physical, chart, labs and discussed the procedure  including the risks, benefits and alternatives for the proposed anesthesia with the patient or authorized representative who has indicated his/her understanding and acceptance.     Dental Advisory Given  Plan Discussed with: Anesthesiologist, CRNA and Surgeon  Anesthesia Plan Comments: (Patient consented for  risks of anesthesia including but not limited to:  - adverse reactions to medications - damage to eyes, teeth, lips or other oral mucosa - nerve damage due to positioning  - sore throat or hoarseness - Damage to heart, brain, nerves, lungs, other parts of body or loss of life  Patient voiced understanding and assent.)         Anesthesia Quick Evaluation

## 2023-12-09 DIAGNOSIS — H2511 Age-related nuclear cataract, right eye: Secondary | ICD-10-CM | POA: Diagnosis not present

## 2023-12-16 NOTE — Discharge Instructions (Signed)

## 2023-12-17 ENCOUNTER — Encounter
Admission: RE | Disposition: A | Discharge status: Home / Self Care | Payer: Self-pay | Source: Home / Self Care | Attending: Ophthalmology

## 2023-12-17 ENCOUNTER — Ambulatory Visit
Admission: RE | Admit: 2023-12-17 | Discharge: 2023-12-17 | Disposition: A | Discharge status: Home / Self Care | Attending: Ophthalmology | Admitting: Ophthalmology

## 2023-12-17 ENCOUNTER — Ambulatory Visit: Payer: Self-pay | Admitting: Anesthesiology

## 2023-12-17 ENCOUNTER — Other Ambulatory Visit: Payer: Self-pay

## 2023-12-17 ENCOUNTER — Encounter: Payer: Self-pay | Admitting: Ophthalmology

## 2023-12-17 DIAGNOSIS — H2511 Age-related nuclear cataract, right eye: Secondary | ICD-10-CM | POA: Insufficient documentation

## 2023-12-17 DIAGNOSIS — E785 Hyperlipidemia, unspecified: Secondary | ICD-10-CM | POA: Insufficient documentation

## 2023-12-17 DIAGNOSIS — I350 Nonrheumatic aortic (valve) stenosis: Secondary | ICD-10-CM | POA: Diagnosis not present

## 2023-12-17 DIAGNOSIS — I2510 Atherosclerotic heart disease of native coronary artery without angina pectoris: Secondary | ICD-10-CM | POA: Diagnosis not present

## 2023-12-17 DIAGNOSIS — K219 Gastro-esophageal reflux disease without esophagitis: Secondary | ICD-10-CM | POA: Diagnosis not present

## 2023-12-17 DIAGNOSIS — Z87891 Personal history of nicotine dependence: Secondary | ICD-10-CM | POA: Insufficient documentation

## 2023-12-17 HISTORY — DX: Alcohol induced acute pancreatitis without necrosis or infection: K85.20

## 2023-12-17 HISTORY — DX: Arthrodesis status: Z98.1

## 2023-12-17 HISTORY — DX: Gastro-esophageal reflux disease without esophagitis: K21.9

## 2023-12-17 HISTORY — DX: Cardiac murmur, unspecified: R01.1

## 2023-12-17 HISTORY — PX: CATARACT EXTRACTION W/PHACO: SHX586

## 2023-12-17 HISTORY — DX: Unspecified osteoarthritis, unspecified site: M19.90

## 2023-12-17 HISTORY — DX: Personal history of other diseases of the circulatory system: Z86.79

## 2023-12-17 HISTORY — DX: Myoneural disorder, unspecified: G70.9

## 2023-12-17 HISTORY — DX: Other ill-defined heart diseases: I51.89

## 2023-12-17 HISTORY — DX: Bicuspid aortic valve: Q23.81

## 2023-12-17 HISTORY — DX: Nonrheumatic aortic (valve) stenosis: I35.0

## 2023-12-17 HISTORY — DX: Other specified health status: Z78.9

## 2023-12-17 HISTORY — DX: Alcohol abuse, uncomplicated: F10.10

## 2023-12-17 HISTORY — DX: Atherosclerotic heart disease of native coronary artery without angina pectoris: I25.10

## 2023-12-17 SURGERY — PHACOEMULSIFICATION, CATARACT, WITH IOL INSERTION
Anesthesia: Monitor Anesthesia Care | Site: Eye | Laterality: Right

## 2023-12-17 MED ORDER — TETRACAINE HCL 0.5 % OP SOLN
1.0000 [drp] | OPHTHALMIC | Status: DC | PRN
  Administered 2023-12-17 (×3): 1 [drp] via OPHTHALMIC

## 2023-12-17 MED ORDER — TETRACAINE HCL 0.5 % OP SOLN
OPHTHALMIC | Status: AC
  Filled 2023-12-17: qty 4

## 2023-12-17 MED ORDER — ARMC OPHTHALMIC DILATING DROPS
OPHTHALMIC | Status: AC
  Filled 2023-12-17: qty 0.5

## 2023-12-17 MED ORDER — ARMC OPHTHALMIC DILATING DROPS
1.0000 | OPHTHALMIC | Status: DC | PRN
  Administered 2023-12-17 (×3): 1 via OPHTHALMIC

## 2023-12-17 MED ORDER — CEFUROXIME OPHTHALMIC INJECTION 1 MG/0.1 ML
INJECTION | OPHTHALMIC | Status: DC | PRN
  Administered 2023-12-17: 1 mg via INTRACAMERAL

## 2023-12-17 MED ORDER — BRIMONIDINE TARTRATE-TIMOLOL 0.2-0.5 % OP SOLN
OPHTHALMIC | Status: DC | PRN
  Administered 2023-12-17: 1 [drp] via OPHTHALMIC

## 2023-12-17 MED ORDER — SIGHTPATH DOSE#1 NA HYALUR & NA CHOND-NA HYALUR IO KIT
PACK | INTRAOCULAR | Status: DC | PRN
  Administered 2023-12-17: 1 via OPHTHALMIC

## 2023-12-17 MED ORDER — LACTATED RINGERS IV SOLN: INTRAVENOUS | Status: DC

## 2023-12-17 MED ORDER — MIDAZOLAM HCL 2 MG/2ML IJ SOLN
INTRAMUSCULAR | Status: AC
  Filled 2023-12-17: qty 2

## 2023-12-17 MED ORDER — MIDAZOLAM HCL 2 MG/2ML IJ SOLN
INTRAMUSCULAR | Status: DC | PRN
  Administered 2023-12-17 (×2): 2 mg via INTRAVENOUS

## 2023-12-17 MED ORDER — EPINEPHRINE PF 1 MG/ML IJ SOLN
INTRAMUSCULAR | Status: DC | PRN
  Administered 2023-12-17: 50 mL via OPHTHALMIC

## 2023-12-17 MED ORDER — SIGHTPATH DOSE#1 BSS IO SOLN
INTRAOCULAR | Status: DC | PRN
  Administered 2023-12-17: 15 mL via INTRAOCULAR

## 2023-12-17 MED ORDER — LIDOCAINE HCL (PF) 2 % IJ SOLN
INTRAMUSCULAR | Status: DC | PRN
  Administered 2023-12-17: 2 mL

## 2023-12-17 MED ORDER — FENTANYL CITRATE (PF) 100 MCG/2ML IJ SOLN
INTRAMUSCULAR | Status: AC
Start: 2023-12-17 — End: 2023-12-17
  Filled 2023-12-17: qty 2

## 2023-12-17 MED ORDER — FENTANYL CITRATE (PF) 100 MCG/2ML IJ SOLN
INTRAMUSCULAR | Status: DC | PRN
  Administered 2023-12-17: 100 ug via INTRAVENOUS

## 2023-12-17 SURGICAL SUPPLY — 8 items
FEE CATARACT SUITE SIGHTPATH (MISCELLANEOUS) ×2 IMPLANT
GLOVE BIOGEL PI IND STRL 8 (GLOVE) ×2 IMPLANT
GLOVE SURG LX STRL 7.5 STRW (GLOVE) ×2 IMPLANT
GLOVE SURG SYN 6.5 PF PI BL (GLOVE) ×2 IMPLANT
LENS IOL EYHANCE TRC 150 21.5 IMPLANT
NDL FILTER BLUNT 18X1 1/2 (NEEDLE) ×2 IMPLANT
NEEDLE FILTER BLUNT 18X1 1/2 (NEEDLE) ×1 IMPLANT
SYR 3ML LL SCALE MARK (SYRINGE) ×2 IMPLANT

## 2023-12-17 NOTE — Op Note (Signed)
 LOCATION:  [facility]   PREOPERATIVE DIAGNOSIS:  Nuclear sclerotic cataract of the right eye.  H25.11   POSTOPERATIVE DIAGNOSIS:  Nuclear sclerotic cataract of the right eye.   PROCEDURE:  Phacoemulsification with Toric posterior chamber intraocular lens placement of the right eye.  Ultrasound time: Procedure(s): PHACOEMULSIFICATION, CATARACT, WITH IOL INSERTION 4.24 00:30.0 (Right)  LENS:   Implant Name Type Inv. Item Serial No. Manufacturer Lot No. LRB No. Used Action  LENS IOL EYHANCE TRC 150 21.5 - [id number]  LENS IOL EYHANCE TRC 150 21.5 [id number] [employer]  Right 1 Implanted     [id number] Toric intraocular lens with 1.5 diopters of cylindrical power with axis orientation at 4 degrees.   SURGEON:  Dene FABIENE Etienne, MD   ANESTHESIA: Topical with tetracaine  drops and 2% Xylocaine  jelly, augmented with 1% preservative-free intracameral lidocaine . .   COMPLICATIONS:  None.   DESCRIPTION OF PROCEDURE:  The patient was identified in the holding room and transported to the operating suite and placed in the supine position under the operating microscope.  The right eye was identified as the operative eye, and it was prepped and draped in the usual sterile ophthalmic fashion.    A clear-corneal paracentesis incision was made at the 12:00 position.  0.5 ml of preservative-free 1% lidocaine  was injected into the anterior chamber. The anterior chamber was filled with Viscoat.  A 2.4 millimeter near clear corneal incision was then made at the 9:00 position.  A cystotome and capsulorrhexis forceps were then used to make a curvilinear capsulorrhexis.  Hydrodissection and hydrodelineation were then performed using balanced salt solution.   Phacoemulsification was then used in stop and chop fashion to remove the lens, nucleus and epinucleus.  The remaining cortex was aspirated using the irrigation and aspiration handpiece.  Provisc viscoelastic was then placed into the capsular bag to  distend it for lens placement.  The Verion digital marker was used to align the implant at the intended axis.   A Toric lens was then injected into the capsular bag.  It was rotated clockwise until the axis marks on the lens were approximately 15 degrees in the counterclockwise direction to the intended alignment.  The viscoelastic was aspirated from the eye using the irrigation aspiration handpiece.  Then, a Koch spatula through the sideport incision was used to rotate the lens in a clockwise direction until the axis markings of the intraocular lens were lined up with the Verion alignment.  Balanced salt solution was then used to hydrate the wounds. Cefuroxime  0.1 ml of a 10mg /ml solution was injected into the anterior chamber for a dose of 1 mg of intracameral antibiotic at the completion of the case.    The eye was noted to have a physiologic pressure and there was no wound leak noted.   Timolol  and Brimonidine  drops were applied to the eye.  The patient was taken to the recovery room in stable condition having had no complications of anesthesia or surgery.  Hawley Pavia 12/17/2023, 11:06 AM

## 2023-12-17 NOTE — Transfer of Care (Signed)
 Immediate Anesthesia Transfer of Care Note  Patient: Dylan Long  Procedure(s) Performed: PHACOEMULSIFICATION, CATARACT, WITH IOL INSERTION 4.24 00:30.0 (Right: Eye)  Patient Location: PACU  Anesthesia Type: MAC  Level of Consciousness: awake, alert  and patient cooperative  Airway and Oxygen Therapy: Patient Spontanous Breathing and Patient connected to supplemental oxygen  Post-op Assessment: Post-op Vital signs reviewed, Patient's Cardiovascular Status Stable, Respiratory Function Stable, Patent Airway and No signs of Nausea or vomiting  Post-op Vital Signs: Reviewed and stable  Complications: No notable events documented.

## 2023-12-17 NOTE — H&P (Signed)
 [name]   Primary Care Physician:  Shayne Anes, MD Ophthalmologist: Dr. Dene Etienne  Pre-Procedure History & Physical: HPI:  Dylan Long is a 73 y.o. male here for ophthalmic surgery.   Past Medical History:  Diagnosis Date   Alcohol  abuse    Alcohol  consumption of more than four drinks per day    Alcoholic pancreatitis    Aortic stenosis    Aortic valve sclerosis    with functionally bicuspid aortic valve   Arthritis    Bicuspid aortic valve    CAD (coronary artery disease)    Dyslipidemia    GERD (gastroesophageal reflux disease)    Grade I diastolic dysfunction    Heart murmur    History of heart murmur in childhood    History of lumbar fusion    Hyperlipidemia    Mild aortic stenosis by prior echocardiogram    Neuromuscular disorder (HCC)    Pancreatitis     Past Surgical History:  Procedure Laterality Date   LUMBAR FUSION      Prior to Admission medications   Medication Sig Start Date End Date Taking? Authorizing Provider  cyclobenzaprine  (FLEXERIL ) 10 MG tablet Take 10 mg by mouth 3 (three) times daily as needed for muscle spasms.    Yes [provider]  ezetimibe -simvastatin  (VYTORIN ) 10-10 MG tablet Take 1 tablet by mouth at bedtime. 03/10/19  Yes [place], Peter M, MD  HYDROcodone -acetaminophen  (NORCO/VICODIN) 5-325 MG tablet Take 1-2 tablets by mouth every 6 (six) hours as needed for pain. 06/27/16  Yes [provider]  pantoprazole  (PROTONIX ) 40 MG tablet Take 40 mg by mouth daily.   Yes [provider]  temazepam  (RESTORIL ) 30 MG capsule Take 30 mg by mouth at bedtime as needed for sleep. 08/15/16  Yes [provider]    Allergies as of 12/02/2023   (No Known Allergies)    Family History  Problem Relation Age of Onset   Stroke Mother    Heart disease Father    Heart attack Father    Hyperlipidemia Brother        Dyslipidemia    Social History   Socioeconomic History   Marital status:  Married    Spouse name: Not on file   Number of children: 2   Years of education: Not on file   Highest education level: Not on file  Occupational History   Occupation: Holiday representative  Tobacco Use   Smoking status: Former    Current packs/day: 0.00    Types: Cigarettes    Quit date: 09/26/1999    Years since quitting: 24.2   Smokeless tobacco: Never  Vaping Use   Vaping status: Never Used  Substance and Sexual Activity   Alcohol  use: Yes    Comment: 3-4 beers (12 oz) a day   Drug use: Yes    Types: Marijuana    Comment: years ago   Sexual activity: Yes  Other Topics Concern   Not on file  Social History Narrative   Not on file   Social Drivers of Health   Financial Resource Strain: Not on file  Food Insecurity: Not on file  Transportation Needs: Not on file  Physical Activity: Not on file  Stress: Not on file  Social Connections: Not on file  Intimate Partner Violence: Not on file    Review of Systems: See HPI, otherwise negative ROS  Physical Exam: BP (!) 157/105   Temp 98.1 F (36.7 C) (Temporal)   Resp 15   Ht  6' 2.76 (1.899 m)   Wt 83.1 kg   SpO2 98%   BMI 23.03 kg/m  General:   Alert,  pleasant and cooperative in NAD Head:  Normocephalic and atraumatic. Lungs:  Clear to auscultation.    Heart:  Regular rate and rhythm.   Impression/Plan: Dylan Long is here for ophthalmic surgery.  Risks, benefits, limitations, and alternatives regarding ophthalmic surgery have been reviewed with the patient.  Questions have been answered.  All parties agreeable.   MITTIE GASKIN, MD  12/17/2023, 10:06 AM

## 2023-12-17 NOTE — Anesthesia Postprocedure Evaluation (Signed)
 Anesthesia Post Note  Patient: Dylan Long  Procedure(s) Performed: PHACOEMULSIFICATION, CATARACT, WITH IOL INSERTION 4.24 00:30.0 (Right: Eye)  Patient location during evaluation: PACU Anesthesia Type: MAC Level of consciousness: awake and alert Pain management: pain level controlled Vital Signs Assessment: post-procedure vital signs reviewed and stable Respiratory status: spontaneous breathing, nonlabored ventilation, respiratory function stable and patient connected to nasal cannula oxygen Cardiovascular status: stable and blood pressure returned to baseline Postop Assessment: no apparent nausea or vomiting Anesthetic complications: no   No notable events documented.   Last Vitals:  Vitals:   12/17/23 1111 12/17/23 1112  BP: (!) 125/91   Pulse:  66  Resp: 12 18  Temp:    SpO2:  96%    Last Pain:  Vitals:   12/17/23 1112  TempSrc:   PainSc: 0-No pain                 Dennys Guin C Jeet Shough

## 2024-02-12 DIAGNOSIS — L570 Actinic keratosis: Secondary | ICD-10-CM | POA: Diagnosis not present

## 2024-04-01 DIAGNOSIS — L570 Actinic keratosis: Secondary | ICD-10-CM | POA: Diagnosis not present
